# Patient Record
Sex: Female | Born: 1985 | Race: White | Hispanic: No | Marital: Married | Smoking: Never smoker
Health system: Southern US, Community
[De-identification: ages and names within clinical notes are randomized; demographics above are authoritative.]

---

## 2007-10-21 ENCOUNTER — Inpatient Hospital Stay: Payer: Self-pay | Admitting: Obstetrics and Gynecology

## 2007-11-30 LAB — HM PAP SMEAR

## 2010-08-30 ENCOUNTER — Emergency Department (HOSPITAL_COMMUNITY)
Admission: EM | Admit: 2010-08-30 | Discharge: 2010-08-30 | Disposition: A | Discharge status: Home / Self Care | Payer: BC Managed Care – PPO | Attending: Emergency Medicine | Admitting: Emergency Medicine

## 2010-08-30 DIAGNOSIS — R197 Diarrhea, unspecified: Secondary | ICD-10-CM | POA: Insufficient documentation

## 2010-08-30 DIAGNOSIS — R1915 Other abnormal bowel sounds: Secondary | ICD-10-CM | POA: Insufficient documentation

## 2010-08-30 DIAGNOSIS — R51 Headache: Secondary | ICD-10-CM | POA: Insufficient documentation

## 2010-08-30 DIAGNOSIS — R112 Nausea with vomiting, unspecified: Secondary | ICD-10-CM | POA: Insufficient documentation

## 2010-08-30 LAB — COMPREHENSIVE METABOLIC PANEL
ALT: 18 U/L (ref 0–35)
AST: 27 U/L (ref 0–37)
Albumin: 4.8 g/dL (ref 3.5–5.2)
Alkaline Phosphatase: 55 U/L (ref 39–117)
BUN: 17 mg/dL (ref 6–23)
Chloride: 108 mEq/L (ref 96–112)
Potassium: 4.4 mEq/L (ref 3.5–5.1)
Sodium: 139 mEq/L (ref 135–145)
Total Bilirubin: 0.9 mg/dL (ref 0.3–1.2)
Total Protein: 8.6 g/dL — ABNORMAL HIGH (ref 6.0–8.3)

## 2010-08-30 LAB — DIFFERENTIAL
Basophils Absolute: 0 10*3/uL (ref 0.0–0.1)
Eosinophils Absolute: 0 10*3/uL (ref 0.0–0.7)
Lymphocytes Relative: 3 % — ABNORMAL LOW (ref 12–46)
Lymphs Abs: 0.6 10*3/uL — ABNORMAL LOW (ref 0.7–4.0)
Neutrophils Relative %: 94 % — ABNORMAL HIGH (ref 43–77)

## 2010-08-30 LAB — CBC
MCV: 88.5 fL (ref 78.0–100.0)
Platelets: 347 10*3/uL (ref 150–400)
RBC: 5.04 MIL/uL (ref 3.87–5.11)
WBC: 18.9 10*3/uL — ABNORMAL HIGH (ref 4.0–10.5)

## 2011-08-06 ENCOUNTER — Ambulatory Visit: Payer: BC Managed Care – PPO | Admitting: Internal Medicine

## 2011-09-16 ENCOUNTER — Encounter: Payer: Self-pay | Admitting: Internal Medicine

## 2011-09-16 ENCOUNTER — Other Ambulatory Visit (HOSPITAL_COMMUNITY)
Admission: RE | Admit: 2011-09-16 | Discharge: 2011-09-16 | Disposition: A | Discharge status: Home / Self Care | Payer: BC Managed Care – PPO | Source: Ambulatory Visit | Attending: Internal Medicine | Admitting: Internal Medicine

## 2011-09-16 ENCOUNTER — Ambulatory Visit (INDEPENDENT_AMBULATORY_CARE_PROVIDER_SITE_OTHER): Payer: BC Managed Care – PPO | Admitting: Internal Medicine

## 2011-09-16 VITALS — BP 100/70 | HR 82 | Temp 98.7°F | Ht 61.0 in | Wt 148.0 lb

## 2011-09-16 DIAGNOSIS — Z113 Encounter for screening for infections with a predominantly sexual mode of transmission: Secondary | ICD-10-CM | POA: Insufficient documentation

## 2011-09-16 DIAGNOSIS — Z7189 Other specified counseling: Secondary | ICD-10-CM

## 2011-09-16 DIAGNOSIS — R87810 Cervical high risk human papillomavirus (HPV) DNA test positive: Secondary | ICD-10-CM | POA: Insufficient documentation

## 2011-09-16 DIAGNOSIS — Z0000 Encounter for general adult medical examination without abnormal findings: Secondary | ICD-10-CM

## 2011-09-16 DIAGNOSIS — G43909 Migraine, unspecified, not intractable, without status migrainosus: Secondary | ICD-10-CM

## 2011-09-16 DIAGNOSIS — Z01419 Encounter for gynecological examination (general) (routine) without abnormal findings: Secondary | ICD-10-CM | POA: Insufficient documentation

## 2011-09-16 DIAGNOSIS — J45909 Unspecified asthma, uncomplicated: Secondary | ICD-10-CM | POA: Insufficient documentation

## 2011-09-16 LAB — LIPID PANEL
Cholesterol: 176 mg/dL (ref 0–200)
HDL: 49.8 mg/dL (ref >39.00)
LDL Cholesterol: 106 mg/dL — ABNORMAL HIGH (ref 0–99)
VLDL: 20.6 mg/dL (ref 0.0–40.0)

## 2011-09-16 LAB — CBC WITH DIFFERENTIAL/PLATELET
Basophils Absolute: 0.1 10*3/uL (ref 0.0–0.1)
Eosinophils Relative: 5.7 % — ABNORMAL HIGH (ref 0.0–5.0)
HCT: 39.7 % (ref 36.0–46.0)
Hemoglobin: 13.4 g/dL (ref 12.0–15.0)
Lymphocytes Relative: 23 % (ref 12.0–46.0)
Lymphs Abs: 2.4 10*3/uL (ref 0.7–4.0)
Monocytes Relative: 6.2 % (ref 3.0–12.0)
Neutro Abs: 6.7 10*3/uL (ref 1.4–7.7)
RBC: 4.4 Mil/uL (ref 3.87–5.11)
RDW: 12.9 % (ref 11.5–14.6)
WBC: 10.4 10*3/uL (ref 4.5–10.5)

## 2011-09-16 LAB — COMPREHENSIVE METABOLIC PANEL
BUN: 9 mg/dL (ref 6–23)
CO2: 26 mEq/L (ref 19–32)
Calcium: 9.2 mg/dL (ref 8.4–10.5)
Chloride: 106 mEq/L (ref 96–112)
Creatinine, Ser: 0.7 mg/dL (ref 0.4–1.2)
GFR: 106.03 mL/min (ref >60.00)
Glucose, Bld: 93 mg/dL (ref 70–99)
Total Bilirubin: 0.4 mg/dL (ref 0.3–1.2)

## 2011-09-16 MED ORDER — ALBUTEROL SULFATE HFA 108 (90 BASE) MCG/ACT IN AERS: 2.0000 | INHALATION_SPRAY | Freq: Four times a day (QID) | RESPIRATORY_TRACT | Status: AC | PRN

## 2011-09-16 MED ORDER — PRENATAL AD PO TABS: 1.0000 | ORAL_TABLET | Freq: Every day | ORAL | Status: DC

## 2011-09-16 NOTE — Progress Notes (Signed)
Subjective:    Patient ID: Caitlin Le, female    DOB: 04-05-1986, 26 y.o.   MRN: [id number]  HPI 26YO female presents to establish care and for annual exam.  Only concern today is several year h/o migraine headaches described as pressure over temples, with no preceding aura.  No visual changes. Pos phono and photophobia.  Pos nausea.  Takes ibuprofen with minimal improvement. Headaches now occur 1x per week.  Never been on preventative meds.  Otherwise, feeling well. No recent dyspnea or cough.  Has not recently used asthma inhaler.  Overdue for PAP. Not using birth control. UTD on immunizations.  Outpatient Encounter Prescriptions as of 09/16/2011  Medication Sig Dispense Refill  . albuterol (PROVENTIL HFA;VENTOLIN HFA) 108 (90 BASE) MCG/ACT inhaler Inhale 2 puffs into the lungs every 6 (six) hours as needed.  1 Inhaler  3  . DISCONTD: albuterol (PROVENTIL HFA;VENTOLIN HFA) 108 (90 BASE) MCG/ACT inhaler Inhale 2 puffs into the lungs every 6 (six) hours as needed.      . Prenatal Vit-DSS-Fe Cbn-FA (PRENATAL AD) tablet Take 1 tablet by mouth daily.  90 tablet  3    Review of Systems  Constitutional: Negative for fever, chills, appetite change, fatigue and unexpected weight change.  HENT: Negative for ear pain, congestion, sore throat, trouble swallowing, neck pain, voice change and sinus pressure.   Eyes: Negative for visual disturbance.  Respiratory: Negative for cough, shortness of breath, wheezing and stridor.   Cardiovascular: Negative for chest pain, palpitations and leg swelling.  Gastrointestinal: Negative for nausea, vomiting, abdominal pain, diarrhea, constipation, blood in stool, abdominal distention and anal bleeding.  Genitourinary: Negative for dysuria and flank pain.  Musculoskeletal: Negative for myalgias, arthralgias and gait problem.  Skin: Negative for color change and rash.  Neurological: Positive for headaches. Negative for dizziness.  Hematological: Negative for  adenopathy. Does not bruise/bleed easily.  Psychiatric/Behavioral: Negative for suicidal ideas, sleep disturbance and dysphoric mood. The patient is not nervous/anxious.    BP 100/70  Pulse 82  Temp(Src) 98.7 F (37.1 C) (Oral)  Ht 5\' 1"  (1.549 m)  Wt 148 lb (67.132 kg)  BMI 27.96 kg/m2  SpO2 99%  LMP 08/31/2011     Objective:   Physical Exam  Constitutional: She is oriented to person, place, and time. She appears well-developed and well-nourished. No distress.  HENT:  Head: Normocephalic and atraumatic.  Right Ear: External ear normal.  Left Ear: External ear normal.  Nose: Nose normal.  Mouth/Throat: Oropharynx is clear and moist. No oropharyngeal exudate.  Eyes: Conjunctivae are normal. Pupils are equal, round, and reactive to light. Right eye exhibits no discharge. Left eye exhibits no discharge. No scleral icterus.  Neck: Normal range of motion. Neck supple. No tracheal deviation present. No thyromegaly present.  Cardiovascular: Normal rate, regular rhythm, normal heart sounds and intact distal pulses.  Exam reveals no gallop and no friction rub.   No murmur heard. Pulmonary/Chest: Effort normal and breath sounds normal. No respiratory distress. She has no wheezes. She has no rales. She exhibits no tenderness. Right breast exhibits no inverted nipple, no mass, no nipple discharge, no skin change and no tenderness. Left breast exhibits no inverted nipple, no mass, no nipple discharge, no skin change and no tenderness. Breasts are symmetrical.  Abdominal: Soft. Bowel sounds are normal. She exhibits no distension and no mass. There is no tenderness. There is no rebound and no guarding.  Genitourinary: Rectum normal, vagina normal and uterus normal. No breast swelling, tenderness, discharge or  bleeding. Pelvic exam was performed with patient prone. There is no rash, tenderness or lesion on the right labia. There is no rash, tenderness or lesion on the left labia. Uterus is not enlarged  and not tender. Cervix exhibits discharge. Cervix exhibits no motion tenderness and no friability. Right adnexum displays no mass, no tenderness and no fullness. Left adnexum displays no mass, no tenderness and no fullness. No erythema or tenderness around the vagina. No vaginal discharge found.       Limited exam because of pt discomfort, attempted using small speculum with minimal improvement.  Musculoskeletal: Normal range of motion. She exhibits no edema and no tenderness.  Lymphadenopathy:    She has no cervical adenopathy.  Neurological: She is alert and oriented to person, place, and time. No cranial nerve deficit. She exhibits normal muscle tone. Coordination normal.  Skin: Skin is warm and dry. No rash noted. She is not diaphoretic. No erythema. No pallor.  Psychiatric: She has a normal mood and affect. Her behavior is normal. Judgment and thought content normal.          Assessment & Plan:

## 2011-09-16 NOTE — Assessment & Plan Note (Signed)
Currently asymptomatic.  Will refill albuterol for use prn.

## 2011-09-16 NOTE — Assessment & Plan Note (Addendum)
Exam normal today. PAP is pending. Immunizations are UTD.  Will check CBC,CMP, lipids with labs.  Will start prenatal vitamin.

## 2011-09-16 NOTE — Assessment & Plan Note (Signed)
Headaches c/w migraines.  Not a good candidate for preventative med such as pamelor given not using birth control.  Will try using Relpax or Zomig for rescue.  Pt will email or call with update. Follow up 1 month.

## 2011-09-23 ENCOUNTER — Telehealth: Payer: Self-pay | Admitting: *Deleted

## 2011-09-23 NOTE — Telephone Encounter (Signed)
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 She will schedule her follow up pap smear for 6 months when she is here for her follow up in April.

## 2011-11-15 ENCOUNTER — Ambulatory Visit (INDEPENDENT_AMBULATORY_CARE_PROVIDER_SITE_OTHER): Payer: BC Managed Care – PPO | Admitting: Internal Medicine

## 2011-11-15 ENCOUNTER — Encounter: Payer: Self-pay | Admitting: Internal Medicine

## 2011-11-15 VITALS — BP 114/74 | HR 90 | Temp 98.3°F | Resp 16 | Wt 149.5 lb

## 2011-11-15 DIAGNOSIS — F419 Anxiety disorder, unspecified: Secondary | ICD-10-CM

## 2011-11-15 DIAGNOSIS — F411 Generalized anxiety disorder: Secondary | ICD-10-CM

## 2011-11-15 DIAGNOSIS — R110 Nausea: Secondary | ICD-10-CM | POA: Insufficient documentation

## 2011-11-15 DIAGNOSIS — G43909 Migraine, unspecified, not intractable, without status migrainosus: Secondary | ICD-10-CM

## 2011-11-15 MED ORDER — CITALOPRAM HYDROBROMIDE 10 MG PO TABS: 10.0000 mg | ORAL_TABLET | Freq: Every day | ORAL | Status: DC

## 2011-11-15 MED ORDER — PROMETHAZINE HCL 12.5 MG PO TABS: 12.5000 mg | ORAL_TABLET | Freq: Three times a day (TID) | ORAL | Status: DC | PRN

## 2011-11-15 MED ORDER — ALPRAZOLAM 0.25 MG PO TABS: 0.2500 mg | ORAL_TABLET | Freq: Three times a day (TID) | ORAL | Status: AC | PRN

## 2011-11-15 NOTE — Progress Notes (Signed)
Subjective:    Patient ID: Caitlin Le, female    DOB: [birth year], 26 y.o.   MRN: [id number]  HPI 26 year old female with history of anxiety presents for followup. She reports significant worsening in her symptoms of anxiety and depressed mood over the last few weeks. Her mother was recently diagnosed with possible seizure disorder. She reports significant stress in caring for her mother and others in her family. She is not currently taking any medication for anxiety. She is having difficulty functioning both at work and at home.  In regards to her history of migraine headaches, she reports recent worsening of her headache frequency. She reports no improvement with Zomig. She notes significant nausea with her migraines for which she has used Phenergan in the past with improvement. She would like to restart this.  Outpatient Encounter Prescriptions as of 11/15/2011  Medication Sig Dispense Refill  . albuterol (PROVENTIL HFA;VENTOLIN HFA) 108 (90 BASE) MCG/ACT inhaler Inhale 2 puffs into the lungs every 6 (six) hours as needed.  1 Inhaler  3  . ALPRAZolam (XANAX) 0.25 MG tablet Take 1 tablet (0.25 mg total) by mouth 3 (three) times daily as needed for sleep or anxiety.  90 tablet  3  . citalopram (CELEXA) 10 MG tablet Take 1 tablet (10 mg total) by mouth daily.  30 tablet  3  . Prenatal Vit-DSS-Fe Cbn-FA (PRENATAL AD) tablet Take 1 tablet by mouth daily.  90 tablet  3  . promethazine (PHENERGAN) 12.5 MG tablet Take 1 tablet (12.5 mg total) by mouth every 8 (eight) hours as needed for nausea.  60 tablet  3    Review of Systems  Constitutional: Negative for fever, chills, appetite change, fatigue and unexpected weight change.  HENT: Negative for ear pain, congestion, sore throat, trouble swallowing, neck pain, voice change and sinus pressure.   Eyes: Negative for visual disturbance.  Respiratory: Negative for cough, shortness of breath, wheezing and stridor.   Cardiovascular: Negative for chest  pain, palpitations and leg swelling.  Gastrointestinal: Positive for nausea. Negative for vomiting, abdominal pain, diarrhea, constipation, blood in stool, abdominal distention and anal bleeding.  Genitourinary: Negative for dysuria and flank pain.  Musculoskeletal: Negative for myalgias, arthralgias and gait problem.  Skin: Negative for color change and rash.  Neurological: Positive for headaches. Negative for dizziness.  Hematological: Negative for adenopathy. Does not bruise/bleed easily.  Psychiatric/Behavioral: Positive for dysphoric mood and agitation. Negative for suicidal ideas and sleep disturbance. The patient is nervous/anxious.    BP 114/74  Pulse 90  Temp(Src) 98.3 F (36.8 C) (Oral)  Resp 16  Wt 149 lb 8 oz (67.813 kg)  SpO2 99%     Objective:   Physical Exam  Constitutional: She is oriented to person, place, and time. She appears well-developed and well-nourished. No distress.  HENT:  Head: Normocephalic and atraumatic.  Right Ear: External ear normal.  Left Ear: External ear normal.  Nose: Nose normal.  Mouth/Throat: Oropharynx is clear and moist. No oropharyngeal exudate.  Eyes: Conjunctivae are normal. Pupils are equal, round, and reactive to light. Right eye exhibits no discharge. Left eye exhibits no discharge. No scleral icterus.  Neck: Normal range of motion. Neck supple. No tracheal deviation present. No thyromegaly present.  Cardiovascular: Normal rate, regular rhythm, normal heart sounds and intact distal pulses.  Exam reveals no gallop and no friction rub.   No murmur heard. Pulmonary/Chest: Effort normal and breath sounds normal. No respiratory distress. She has no wheezes. She has no rales. She exhibits  no tenderness.  Musculoskeletal: Normal range of motion. She exhibits no edema and no tenderness.  Lymphadenopathy:    She has no cervical adenopathy.  Neurological: She is alert and oriented to person, place, and time. No cranial nerve deficit. She  exhibits normal muscle tone. Coordination normal.  Skin: Skin is warm and dry. No rash noted. She is not diaphoretic. No erythema. No pallor.  Psychiatric: Her speech is normal and behavior is normal. Judgment and thought content normal. Her mood appears anxious. Cognition and memory are normal. She exhibits a depressed mood.          Assessment & Plan:

## 2011-11-15 NOTE — Assessment & Plan Note (Signed)
Worsened because of recent issues with mother's illness. Will start Celexa 10mg  daily and use xanax prn for severe episodes of anxiety. Discussed that xanax can cause drowsiness and she will not use if driving. Follow up 1 month.

## 2011-11-15 NOTE — Assessment & Plan Note (Signed)
Worsened recently with anxiety and nausea. Will refill phenergan to help with nausea. Treating anxiety as below. Follow up 1 month.

## 2011-12-20 ENCOUNTER — Ambulatory Visit (INDEPENDENT_AMBULATORY_CARE_PROVIDER_SITE_OTHER): Payer: BC Managed Care – PPO | Admitting: Internal Medicine

## 2011-12-20 ENCOUNTER — Encounter: Payer: Self-pay | Admitting: Internal Medicine

## 2011-12-20 VITALS — BP 120/82 | HR 67 | Temp 98.7°F | Ht 61.0 in | Wt 150.5 lb

## 2011-12-20 DIAGNOSIS — F419 Anxiety disorder, unspecified: Secondary | ICD-10-CM

## 2011-12-20 DIAGNOSIS — F411 Generalized anxiety disorder: Secondary | ICD-10-CM

## 2011-12-20 DIAGNOSIS — G43909 Migraine, unspecified, not intractable, without status migrainosus: Secondary | ICD-10-CM

## 2011-12-20 MED ORDER — NORTRIPTYLINE HCL 25 MG PO CAPS: 25.0000 mg | ORAL_CAPSULE | Freq: Every day | ORAL | Status: DC

## 2011-12-20 MED ORDER — ELETRIPTAN HYDROBROMIDE 20 MG PO TABS: 20.0000 mg | ORAL_TABLET | ORAL | Status: DC | PRN

## 2011-12-20 NOTE — Progress Notes (Signed)
Subjective:    Patient ID: Caitlin Le, female    DOB: August 18, 1985, 26 y.o.   MRN: [id number]  HPI 26 year old female with history of anxiety presents for followup. At her last visit, we started Celexa and Xanax to help with symptoms. She reports that she took the Celexa for approximately 2 days then stop. She also tried Xanax on a couple of occasions but felt no benefit so stopped. She reports that anxiety is slightly better controlled recently.  She is concerned today about frequent migraine headaches. She has had migraines for years. They're described as diffuse headaches associated with nausea, photophobia, and photophobia. No aura noted. They have recently been occurring multiple times per week. She has been taking over-the-counter medications such as Advil with no improvement.   Outpatient Encounter Prescriptions as of 12/20/2011  Medication Sig Dispense Refill  . promethazine (PHENERGAN) 12.5 MG tablet Take 1 tablet (12.5 mg total) by mouth every 8 (eight) hours as needed for nausea.  60 tablet  3  . albuterol (PROVENTIL HFA;VENTOLIN HFA) 108 (90 BASE) MCG/ACT inhaler Inhale 2 puffs into the lungs every 6 (six) hours as needed.  1 Inhaler  3  . eletriptan (RELPAX) 20 MG tablet One tablet by mouth at onset of headache. May repeat in 2 hours if headache persists or recurs. may repeat in 2 hours if necessary  10 tablet  0  . nortriptyline (PAMELOR) 25 MG capsule Take 1 capsule (25 mg total) by mouth at bedtime.  30 capsule  3  . Prenatal Vit-DSS-Fe Cbn-FA (PRENATAL AD) tablet Take 1 tablet by mouth daily.  90 tablet  3  . DISCONTD: citalopram (CELEXA) 10 MG tablet Take 1 tablet (10 mg total) by mouth daily.  30 tablet  3    Review of Systems  Constitutional: Negative for fever, chills, appetite change, fatigue and unexpected weight change.  HENT: Negative for ear pain, congestion, sore throat, trouble swallowing, neck pain, voice change and sinus pressure.   Eyes: Negative for visual  disturbance.  Respiratory: Negative for cough, shortness of breath, wheezing and stridor.   Cardiovascular: Negative for chest pain, palpitations and leg swelling.  Gastrointestinal: Negative for nausea, vomiting, abdominal pain, diarrhea, constipation, blood in stool, abdominal distention and anal bleeding.  Genitourinary: Negative for dysuria and flank pain.  Musculoskeletal: Negative for myalgias, arthralgias and gait problem.  Skin: Negative for color change and rash.  Neurological: Positive for headaches. Negative for dizziness.  Hematological: Negative for adenopathy. Does not bruise/bleed easily.  Psychiatric/Behavioral: Negative for suicidal ideas, disturbed wake/sleep cycle and dysphoric mood. The patient is nervous/anxious.    BP 120/82  Pulse 67  Temp 98.7 F (37.1 C) (Oral)  Ht 5\' 1"  (1.549 m)  Wt 150 lb 8 oz (68.266 kg)  BMI 28.44 kg/m2  SpO2 98%  LMP 12/13/2011     Objective:   Physical Exam  Constitutional: She is oriented to person, place, and time. She appears well-developed and well-nourished. No distress.  HENT:  Head: Normocephalic and atraumatic.  Right Ear: External ear normal.  Left Ear: External ear normal.  Nose: Nose normal.  Mouth/Throat: Oropharynx is clear and moist. No oropharyngeal exudate.  Eyes: Conjunctivae are normal. Pupils are equal, round, and reactive to light. Right eye exhibits no discharge. Left eye exhibits no discharge. No scleral icterus.  Neck: Normal range of motion. Neck supple. No tracheal deviation present. No thyromegaly present.  Cardiovascular: Normal rate, regular rhythm, normal heart sounds and intact distal pulses.  Exam reveals no gallop  and no friction rub.   No murmur heard. Pulmonary/Chest: Effort normal and breath sounds normal. No respiratory distress. She has no wheezes. She has no rales. She exhibits no tenderness.  Musculoskeletal: Normal range of motion. She exhibits no edema and no tenderness.  Lymphadenopathy:     She has no cervical adenopathy.  Neurological: She is alert and oriented to person, place, and time. No cranial nerve deficit. She exhibits normal muscle tone. Coordination normal.  Skin: Skin is warm and dry. No rash noted. She is not diaphoretic. No erythema. No pallor.  Psychiatric: She has a normal mood and affect. Her behavior is normal. Judgment and thought content normal.          Assessment & Plan:

## 2011-12-20 NOTE — Assessment & Plan Note (Signed)
Will try adding nortriptyline at bedtime to help prevent migraines. Will also use Relpax as needed for episodes of headache. She will followup in one month to assess progress.

## 2011-12-20 NOTE — Assessment & Plan Note (Signed)
Will try changing to nortriptyline both to help with anxiety and with migraines. Patient will start 25 mg at bedtime. She will call or e-mail if any problems with this medication. She'll followup in one month.

## 2012-01-28 ENCOUNTER — Ambulatory Visit: Payer: BC Managed Care – PPO | Admitting: Internal Medicine

## 2012-03-10 ENCOUNTER — Encounter: Payer: Self-pay | Admitting: Internal Medicine

## 2012-03-10 ENCOUNTER — Ambulatory Visit (INDEPENDENT_AMBULATORY_CARE_PROVIDER_SITE_OTHER): Payer: BC Managed Care – PPO | Admitting: Internal Medicine

## 2012-03-10 VITALS — BP 110/70 | HR 72 | Temp 98.2°F | Ht 61.0 in | Wt 144.2 lb

## 2012-03-10 DIAGNOSIS — IMO0001 Reserved for inherently not codable concepts without codable children: Secondary | ICD-10-CM | POA: Insufficient documentation

## 2012-03-10 DIAGNOSIS — Z309 Encounter for contraceptive management, unspecified: Secondary | ICD-10-CM

## 2012-03-10 MED ORDER — LEVONORGEST-ETH ESTRAD 91-DAY 0.15-0.03 MG PO TABS: 1.0000 | ORAL_TABLET | Freq: Every day | ORAL | Status: DC

## 2012-03-10 NOTE — Progress Notes (Signed)
  Subjective:    Patient ID: Caitlin Le, female    DOB: 06/10/1986, 26 y.o.   MRN: [id number]  HPI 26 year old female with history of anxiety and migraine headaches presents for acute visit. Her primary concern today is need for contraception. She reports that she is generally doing well and anxiety and migraines have been much better controlled since she got out of a bad relationship. She would like to start contraception. She does not wish to become pregnant at this time. She denies any recent sexual activity. She denies any vaginal discharge, vaginal pain, pelvic pain, or other concerns.  Outpatient Encounter Prescriptions as of 03/10/2012  Medication Sig Dispense Refill  . albuterol (PROVENTIL HFA;VENTOLIN HFA) 108 (90 BASE) MCG/ACT inhaler Inhale 2 puffs into the lungs every 6 (six) hours as needed.  1 Inhaler  3  . levonorgestrel-ethinyl estradiol (SEASONALE,INTROVALE,JOLESSA) 0.15-0.03 MG tablet Take 1 tablet by mouth daily.  1 Package  4   BP 110/70  Pulse 72  Temp 98.2 F (36.8 C) (Oral)  Ht 5\' 1"  (1.549 m)  Wt 144 lb 4 oz (65.431 kg)  BMI 27.26 kg/m2  SpO2 97%  LMP 02/06/2012  Review of Systems  Constitutional: Negative for fever, chills, appetite change, fatigue and unexpected weight change.  HENT: Negative for ear pain, congestion, sore throat, trouble swallowing, neck pain, voice change and sinus pressure.   Eyes: Negative for visual disturbance.  Respiratory: Negative for cough, shortness of breath, wheezing and stridor.   Cardiovascular: Negative for chest pain, palpitations and leg swelling.  Gastrointestinal: Negative for nausea, vomiting, abdominal pain, diarrhea, constipation, blood in stool, abdominal distention and anal bleeding.  Genitourinary: Negative for dysuria and flank pain.  Musculoskeletal: Negative for myalgias, arthralgias and gait problem.  Skin: Negative for color change and rash.  Neurological: Negative for dizziness and headaches.  Hematological:  Negative for adenopathy. Does not bruise/bleed easily.  Psychiatric/Behavioral: Negative for suicidal ideas, disturbed wake/sleep cycle and dysphoric mood. The patient is not nervous/anxious.        Objective:   Physical Exam  Constitutional: She is oriented to person, place, and time. She appears well-developed and well-nourished. No distress.  HENT:  Head: Normocephalic and atraumatic.  Right Ear: External ear normal.  Left Ear: External ear normal.  Nose: Nose normal.  Mouth/Throat: Oropharynx is clear and moist. No oropharyngeal exudate.  Eyes: Conjunctivae are normal. Pupils are equal, round, and reactive to light. Right eye exhibits no discharge. Left eye exhibits no discharge. No scleral icterus.  Neck: Normal range of motion. Neck supple. No tracheal deviation present. No thyromegaly present.  Cardiovascular: Normal rate, regular rhythm, normal heart sounds and intact distal pulses.  Exam reveals no gallop and no friction rub.   No murmur heard. Pulmonary/Chest: Effort normal and breath sounds normal. No respiratory distress. She has no wheezes. She has no rales. She exhibits no tenderness.  Musculoskeletal: Normal range of motion. She exhibits no edema and no tenderness.  Lymphadenopathy:    She has no cervical adenopathy.  Neurological: She is alert and oriented to person, place, and time. No cranial nerve deficit. She exhibits normal muscle tone. Coordination normal.  Skin: Skin is warm and dry. No rash noted. She is not diaphoretic. No erythema. No pallor.  Psychiatric: She has a normal mood and affect. Her behavior is normal. Judgment and thought content normal.          Assessment & Plan:

## 2012-03-10 NOTE — Assessment & Plan Note (Signed)
Patient is interested in starting contraception. We discussed different options including oral contraceptive pills, NuvaRing, Depo Provera, implanted devices and IUD. Will start with OCP. Pt will wait until she starts next menses prior to starting. She will call with any questions or concerns. She will use barrier protection to prevent STDs.

## 2012-09-07 ENCOUNTER — Ambulatory Visit: Payer: BC Managed Care – PPO | Admitting: Internal Medicine

## 2013-01-09 ENCOUNTER — Emergency Department: Payer: Self-pay | Admitting: Emergency Medicine

## 2013-01-10 ENCOUNTER — Emergency Department: Payer: Self-pay | Admitting: Emergency Medicine

## 2013-01-10 LAB — CBC
MCHC: 34 g/dL (ref 32.0–36.0)
MCV: 89 fL (ref 80–100)
Platelet: 284 10*3/uL (ref 150–440)
RBC: 3.88 10*6/uL (ref 3.80–5.20)

## 2013-01-10 LAB — URINALYSIS, COMPLETE
Bilirubin,UR: NEGATIVE
Glucose,UR: NEGATIVE mg/dL (ref 0–75)
Nitrite: POSITIVE
Ph: 5 (ref 4.5–8.0)
Protein: 100
Specific Gravity: 1.014 (ref 1.003–1.030)
WBC UR: 2785 /HPF (ref 0–5)

## 2013-01-10 LAB — BASIC METABOLIC PANEL
Co2: 28 mmol/L (ref 21–32)
EGFR (Non-African Amer.): >60
Glucose: 113 mg/dL — ABNORMAL HIGH (ref 65–99)

## 2013-01-11 LAB — PREGNANCY, URINE: Pregnancy Test, Urine: NEGATIVE m[IU]/mL

## 2013-01-20 LAB — URINALYSIS, COMPLETE
Bilirubin,UR: NEGATIVE
Glucose,UR: NEGATIVE mg/dL (ref 0–75)
Ph: 5 (ref 4.5–8.0)
Protein: 30
Specific Gravity: 1.019 (ref 1.003–1.030)

## 2013-01-20 LAB — COMPREHENSIVE METABOLIC PANEL
Anion Gap: 5 — ABNORMAL LOW (ref 7–16)
BUN: 14 mg/dL (ref 7–18)
Bilirubin,Total: 0.7 mg/dL (ref 0.2–1.0)
Calcium, Total: 9.2 mg/dL (ref 8.5–10.1)
Co2: 27 mmol/L (ref 21–32)
Creatinine: 0.96 mg/dL (ref 0.60–1.30)
EGFR (Non-African Amer.): >60
Osmolality: 266 (ref 275–301)
Potassium: 3.5 mmol/L (ref 3.5–5.1)
SGPT (ALT): 17 U/L (ref 12–78)
Sodium: 132 mmol/L — ABNORMAL LOW (ref 136–145)

## 2013-01-20 LAB — CBC
HCT: 33.5 % — ABNORMAL LOW (ref 35.0–47.0)
HGB: 11.1 g/dL — ABNORMAL LOW (ref 12.0–16.0)
MCH: 29.4 pg (ref 26.0–34.0)
Platelet: 341 10*3/uL (ref 150–440)
WBC: 34.6 10*3/uL — ABNORMAL HIGH (ref 3.6–11.0)

## 2013-01-21 ENCOUNTER — Inpatient Hospital Stay: Payer: Self-pay | Admitting: Internal Medicine

## 2013-01-22 LAB — BASIC METABOLIC PANEL
Anion Gap: 5 — ABNORMAL LOW (ref 7–16)
BUN: 7 mg/dL (ref 7–18)
Chloride: 103 mmol/L (ref 98–107)
Co2: 28 mmol/L (ref 21–32)
Creatinine: 0.83 mg/dL (ref 0.60–1.30)
EGFR (Non-African Amer.): >60
Glucose: 95 mg/dL (ref 65–99)
Osmolality: 270 (ref 275–301)

## 2013-01-22 LAB — CBC WITH DIFFERENTIAL/PLATELET
Basophil #: 0 10*3/uL (ref 0.0–0.1)
Eosinophil %: 1 %
HCT: 27 % — ABNORMAL LOW (ref 35.0–47.0)
HGB: 9.2 g/dL — ABNORMAL LOW (ref 12.0–16.0)
Lymphocyte %: 10.9 %
MCHC: 34.1 g/dL (ref 32.0–36.0)
Neutrophil #: 12.8 10*3/uL — ABNORMAL HIGH (ref 1.4–6.5)
Neutrophil %: 79.4 %
RDW: 13.4 % (ref 11.5–14.5)
WBC: 16 10*3/uL — ABNORMAL HIGH (ref 3.6–11.0)

## 2013-01-23 LAB — URINE CULTURE

## 2013-01-25 LAB — CULTURE, BLOOD (SINGLE)

## 2014-07-31 IMAGING — CT CT ABD-PELV W/O CM
1 of 2 series · 15 of 32 positions shown, 19 images · non-contrast
Comparison: none

REASON FOR EXAM: (1) right cva tenderness WBC 34.6 fever; (2) right cva
tenderness WBC 34.6 fever
COMMENTS:

PROCEDURE:     CT  - CT ABDOMEN AND PELVIS W[DATE] [DATE]
RESULT:     History: CVA tenderness and fever. Graf comparison study: No
prior.
TECHNIQUE: Standard nonenhanced CT obtained. Liver normal. Spleen normal.
Gallbladder normal. No biliary distention. Pancreas normal.

[Series 2: 3mm soft tissue · axial · 0.63mm/px · z∈[+380,+794]mm · 15 of 150 slices shown, 19 images]
[im 6/150  soft-tissue]
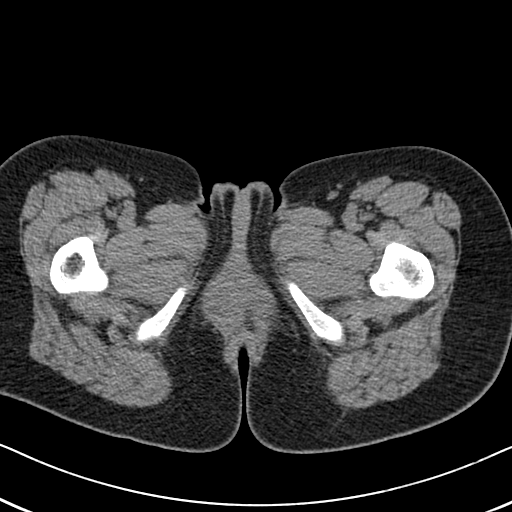
[im 6/150  bone]
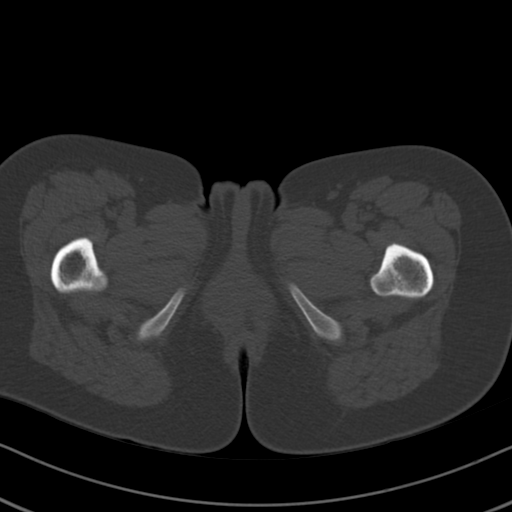
[im 18/150  soft-tissue]
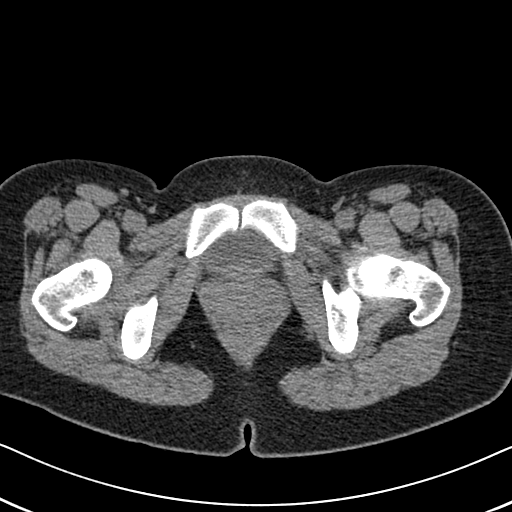
[im 30/150  soft-tissue]
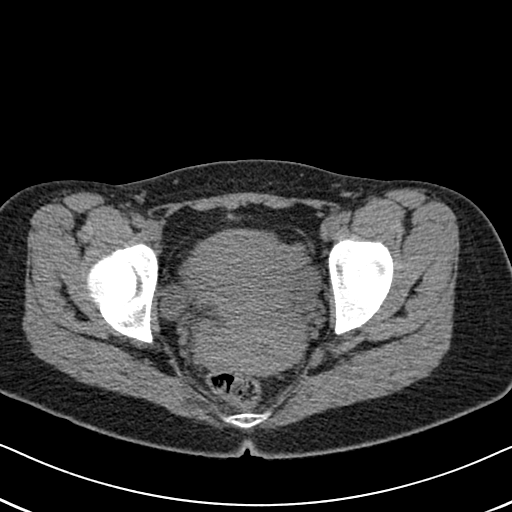
[im 42/150  soft-tissue]
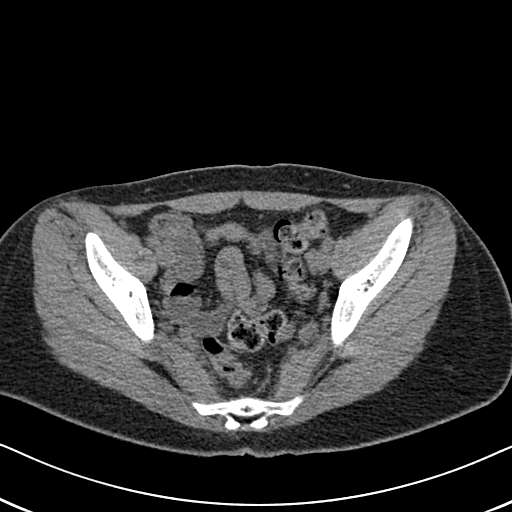
[im 54/150  soft-tissue]
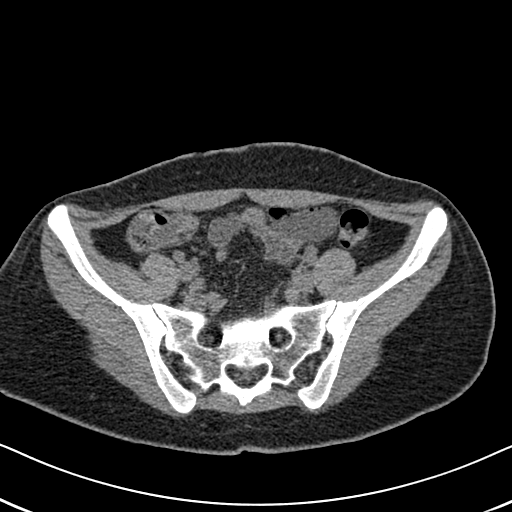
[im 66/150  soft-tissue]
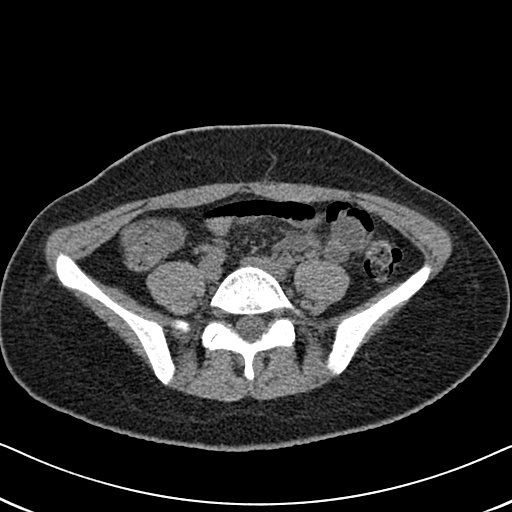
[im 78/150  soft-tissue]
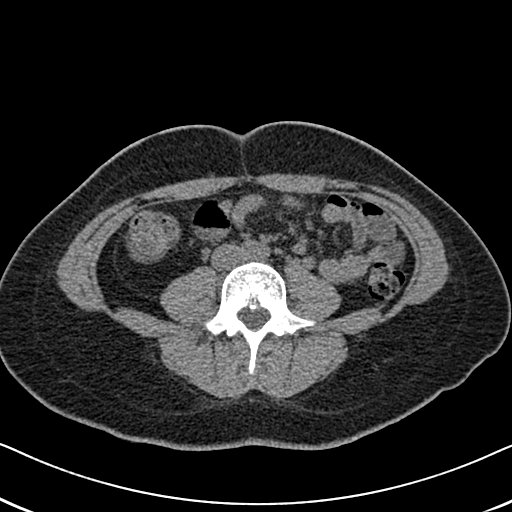
[im 84/150  soft-tissue]
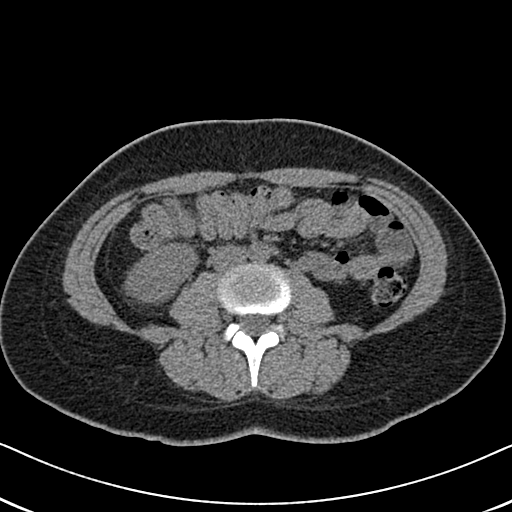
[im 96/150  soft-tissue]
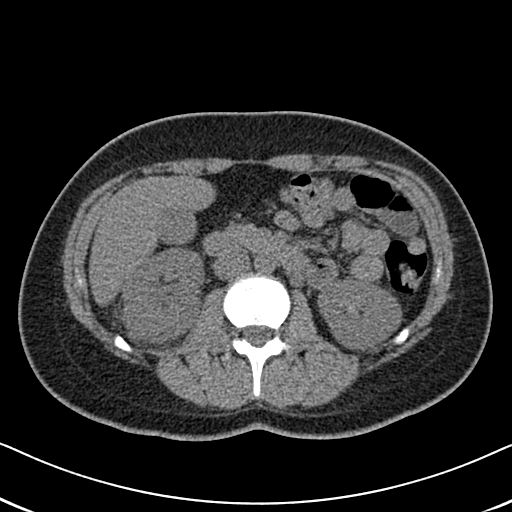
[im 96/150  bone]
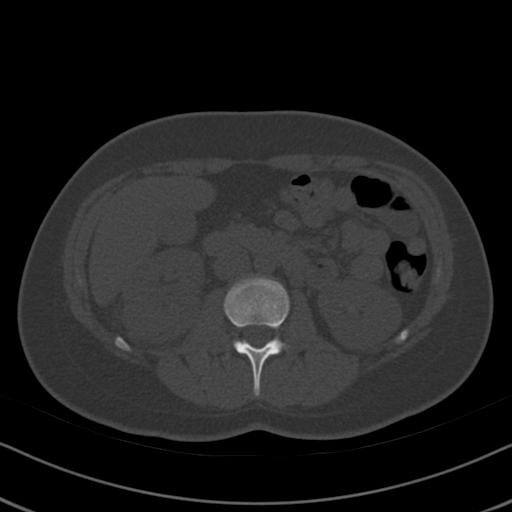
[im 108/150  soft-tissue]
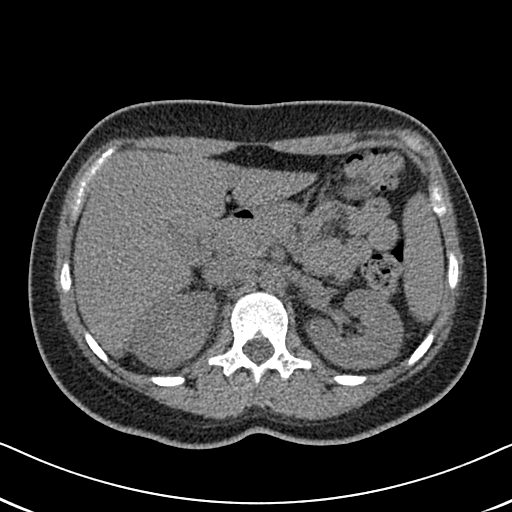
[im 120/150  soft-tissue]
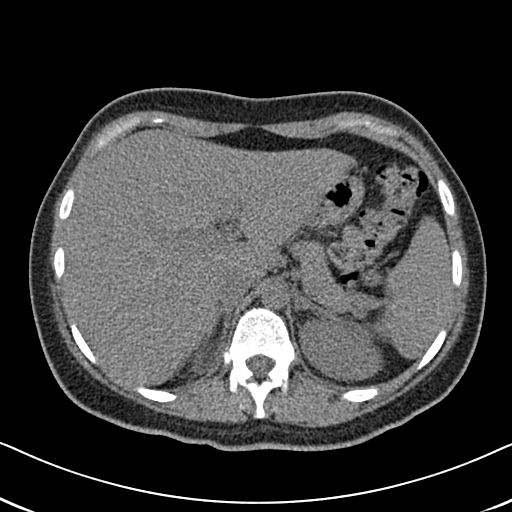
[im 126/150  lung]
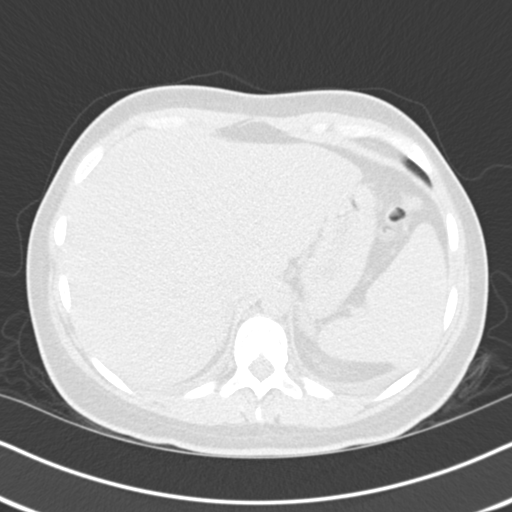
[im 132/150  soft-tissue]
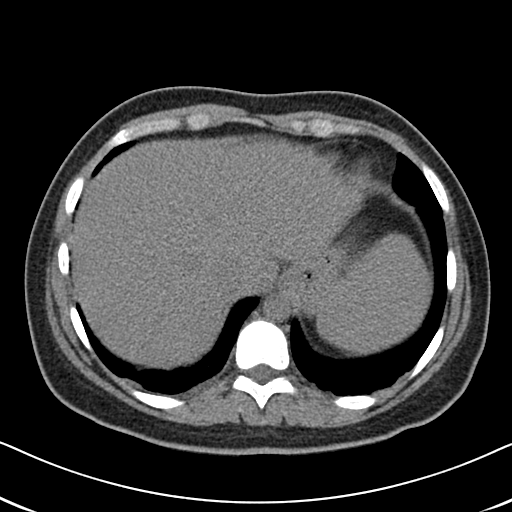
[im 132/150  lung]
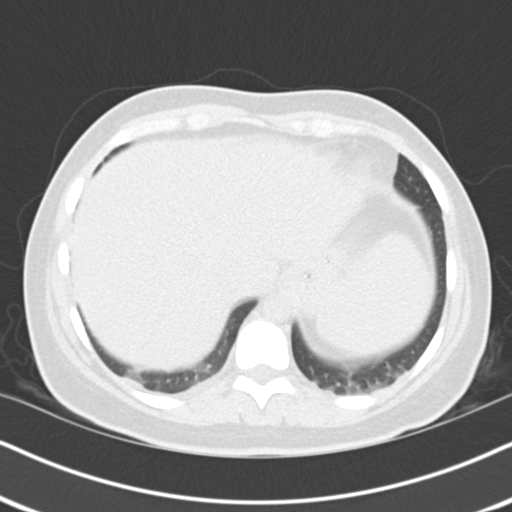
[im 138/150  lung]
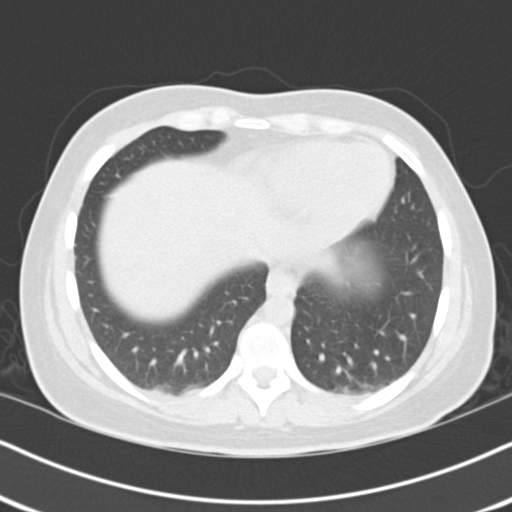
[im 144/150  soft-tissue]
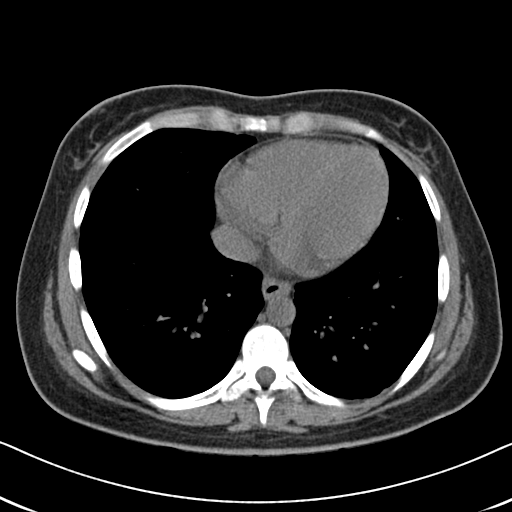
[im 144/150  lung]
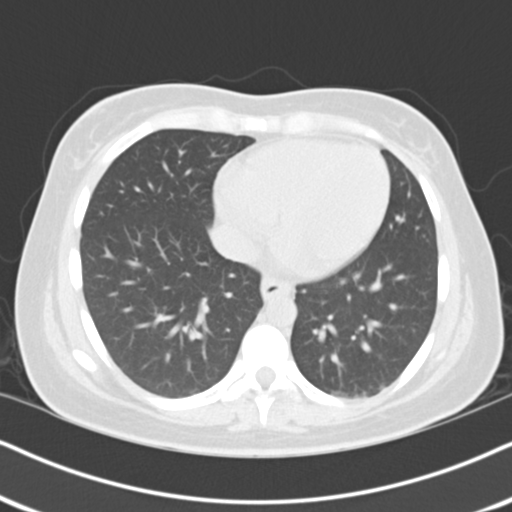

[15 of 32 positions shown; findings below may reference images not displayed]

FINDINGS: Comparison Study: No prior.
Adrenals normal. Mild right renal swelling with perinephric fat plane
streaking present. No ureteral obstruction or stone noted. These changes
could be related to pyelonephritis. Left kidney unremarkable. Left renal
collecting system normal. Bladder is unremarkable. Mild endometrial
thickening is noted. This could be cyclical. No adnexal mass noted.

No adenopathy. Abdominal aorta normal in caliber.

No inflammatory changes noted in right or left or quadrant. No bowel
distention. No free air.

Mild atelectasis lung bases. No acute bony abnormality.
IMPRESSION: Findings suggesting right pyelonephritis.

## 2014-08-30 ENCOUNTER — Ambulatory Visit: Payer: Self-pay | Admitting: Family Medicine

## 2014-10-03 ENCOUNTER — Encounter: Payer: Self-pay | Admitting: Internal Medicine

## 2014-10-07 ENCOUNTER — Emergency Department
Admit: 2014-10-07 | Disposition: A | Discharge status: Home / Self Care | Payer: Self-pay | Admitting: Emergency Medicine

## 2014-10-21 NOTE — H&P (Signed)
PATIENT NAME:  Caitlin Le, Caitlin Le MR#:  [id number] DATE OF BIRTH:  Aug 16, 1985  DATE OF ADMISSION:  01/21/2013  PRIMARY CARE PHYSICIAN: Nonlocal.   REFERRING PHYSICIAN: Dr. Manson PasseyBrown.   CHIEF COMPLAINT: Fever, vomiting and back pain.   HISTORY OF PRESENT ILLNESS: The patient is a 29 year old Caucasian female who was seen by ER physician 1 week ago and diagnosed with a urinary tract infection and discharged home with a prescription for antibiotic. She is coming back with a 1-day history of fever, vomiting, back pain and frequent urination. The patient is reporting that she has not filled the prescription as suggested by the ER physician as she does not have money. Since yesterday, she started having fever associated with vomiting, back pain and frequent urination. Her urine pregnancy test was negative, but WBC count was extremely high at 34.6 and the urinalysis is positive with leuk esterase 1+. The patient was febrile with temperature of 100.5, and she was given vancomycin and levofloxacin after cultures and hospitalist team is called to admit the patient. CT abdomen and pelvis was done without contrast which has showed questionable right-sided perinephric inflammation. The patient is still complaining of low back discomfort during my examination, and her vomiting is significantly resolved today. No other complaints.   PAST MEDICAL HISTORY: None.   PAST SURGICAL HISTORY: None.   ALLERGIES: AMOXICILLIN GIVE HER HIVES AND THROAT CLOSING SENSATION, SHELLFISH.   HOME MEDICATIONS: None.   PSYCHOSOCIAL HISTORY: Lives with boyfriend. Denies smoking, alcohol or illicit drug usage.   FAMILY HISTORY: Reviewed. Denies any.   REVIEW OF SYSTEMS:  CONSTITUTIONAL: Complaining of fever and weakness. Denies any weight loss or weight gain.  EYES: Denies blurry vision, glaucoma.  EARS, NOSE, THROAT: No tinnitus, ear pain.  RESPIRATION: Denies any cough, COPD.   CARDIOVASCULAR: No chest pain, palpitations.   GASTROINTESTINAL: Complaining of nausea, vomiting. Denies any diarrhea. Has lower abdominal pain.  GENITOURINARY: No dysuria but frequent urination. No hematuria.  GYNECOLOGY AND BREASTS: Denies breast mass or vaginal discharge.  ENDOCRINE: Denies polyuria, nocturia, thyroid problems.  HEMATOLOGIC AND LYMPHATIC: Denies any anemia, easy bruising, bleeding.  INTEGUMENTARY: No acne, rash, lesions.  MUSCULOSKELETAL: No joint pain in the neck. Complaining of back pain. Denies any redness.  NEUROLOGIC: Denies any vertigo, ataxia.  PSYCHIATRIC: Denies any ADD, OCD.   PHYSICAL EXAMINATION:  VITAL SIGNS: Temperature 100.5. Blood pressure 102/58 initially with a pulse of 110. Eventually, pulse was 85, blood pressure 96/53. Pulse ox 98%.  GENERAL APPEARANCE: Not in any acute distress but sick-looking, moderately built and moderately nourished.  HEENT: Normocephalic, atraumatic. Pupils are equally reacting to light and accommodation. No scleral icterus. No conjunctival injection. No sinus tenderness. No postnasal drip.  NECK: Supple. No JVD. No thyromegaly. Range of motion is intact.  LUNGS: Clear to auscultation bilaterally. No accessory muscle usage. No anterior chest wall tenderness on palpation.  CARDIAC: S1, S2 normal. Regular rate and rhythm, tachycardic.  GASTROINTESTINAL: Soft. Bowel sounds are positive in all 4 quadrants. Nontender, nondistended. No hepatosplenomegaly. No masses felt. Positive CVA tenderness.  NEUROLOGIC: Awake, alert, oriented x3. Cranial nerves II through XII are intact. Motor and sensory are intact. Reflexes are 2+.  EXTREMITIES: No edema. No cyanosis. No clubbing.   SKIN: Warm to touch. Normal turgor. No rashes. No lesions.  MUSCULOSKELETAL: No joint effusion, tenderness or erythema.   LABS AND IMAGING STUDIES: Urine pregnancy test is negative. Glucose 108, BUN 14, creatinine 0.96, sodium 132, potassium 3.5. Anion gap 5. Serum osmolality 266. LFTs: Albumin  3.2. The rest of  the LFTs are normal. WBC is elevated , hemoglobin 11.1, hematocrit 33.5, platelets 341. Urinalysis yellow in color, hazy in appearance, ketones 1+, specific gravity 1.019, pH 5.0, leuk esterase 1+, nitrites negative, bacteria 2+. CAT scan of the abdomen and pelvis has revealed questionable perinephric inflammation on the right. The possibility of pyelonephritis cannot be excluded.   ASSESSMENT AND PLAN:  1. Acute pyelonephritis: Will admit the patient to medical/surgical unit. Will provide her intravenous fluids. Blood cultures and urine cultures were obtained. Will provide her with intravenous levofloxacin and vancomycin.  2. Hydration with intravenous fluids.  3. Antinausea medication.  4. Fever from acute appendicitis: Will provide her Tylenol as needed.  5. History of migraines: Will provide her Fioricet on as needed basis.  6. Will provide her gastrointestinal prophylaxis.   Plan of care was discussed in detail with the patient. She is aware of the plan.   TOTAL TIME SPENT ON ADMISSION: 45 minutes.   ____________________________ Ramonita Lab, MD ag:gb D: 01/21/2013 02:57:46 ET T: 01/21/2013 03:32:36 ET JOB#: [id number]  cc: Ramonita Lab, MD, <Dictator> Ramonita Lab MD ELECTRONICALLY SIGNED 01/24/2013 7:09

## 2014-10-21 NOTE — Discharge Summary (Signed)
PATIENT NAME:  Caitlin Le, Caitlin Le MR#:  [id number] DATE OF BIRTH:  [birth year]  DATE OF ADMISSION:  01/21/2013  ADMITTING PHYSICIAN:  Dr. Margaretmary Eddy  DATE OF DISCHARGE:  01/22/2013  DISCHARGING PHYSICIAN:  Dr. Gladstone Lighter  CONSULTATIONS IN THE HOSPITAL:  None.  PRIMARY PHYSICIAN:  Nonlocal, at [facility].   DISCHARGE DIAGNOSES: 1.  Systemic inflammatory response syndrome.  2.  Right-sided pyelonephritis.  3.  Tension headache.  DISCHARGE MEDICATIONS:  1.  Norco 5/325 mg 1 tablet p.o. q. 6 hours p.r.n.  2.  Promethazine 12.5 mg tablet 1 to 2 tablets q. 6 hours p.r.n. for nausea.  3.  Levaquin 500 mg p.o. daily for 5 more days.   DISCHARGE DIET:  Regular diet.   DISCHARGE ACTIVITY:  As tolerated.   FOLLOWUP INSTRUCTIONS:  PCP follow up in 2 to 3 weeks.   LABS AND IMAGING STUDIES PRIOR TO DISCHARGE:  WBC 16.0, whereas  the WBC on admission was elevated at 34.6, platelet count is 282, hemoglobin 9.2, hematocrit 27.0. Sodium 136, potassium 3.6, chloride 103, bicarb 28, BUN 7, creatinine 0.83, glucose 95, calcium 8.7. Blood cultures remain negative. CT of the abdomen and pelvis without contrast revealing right-sided pyelonephritis. Urinalysis was 1+ leuk esterase, 48 WBCs, and 2+ bacteria. ALT 17, AST 13, alk phos 78, total bilirubin 0.7, albumin 3.2. Urine cultures growing greater than 100,000 colonies of Gram-negative rod.  BRIEF HOSPITAL COURSE:  Caitlin Le is a 29 year old Caucasian female with no significant past medical history, presented to the hospital secondary to fever, chills, abdominal pain and also headache. She was found to have a fever and also elevated white count of 34.6, and had a CT scan which showed right pyelonephritis.   1.  Systemic inflammatory response syndrome secondary to right pyelonephritis. Admitted, started on IV Levaquin. Blood cultures were negative. Urine cultures were growing Gram-negative rods at the time of discharge. She responded well by dropping her  white count to about 16,000, so is being discharged to home on p.o. Levaquin. Further sensitivities need to be followed up on this, urine cultures.   2.  Headache, likely tension headache. She has history of migraines. She was requiring IV morphine while in the hospital, and is being discharged on p.o. Norco at this time.   Her course has been otherwise uneventful in the hospital.   DISCHARGE CONDITION:  Stable.   DISCHARGE DISPOSITION:  Home.   Time spent on discharge:  40 minutes.    ____________________________ Gladstone Lighter, MD rk:mr D: 01/22/2013 15:30:10 ET T: 01/22/2013 22:34:21 ET JOB#: [id number]  cc: Gladstone Lighter, MD, <Dictator> Gladstone Lighter MD ELECTRONICALLY SIGNED 02/01/2013 15:10

## 2016-11-06 ENCOUNTER — Emergency Department
Admission: EM | Admit: 2016-11-06 | Discharge: 2016-11-06 | Disposition: A | Discharge status: Home / Self Care | Payer: Medicaid Other | Attending: Emergency Medicine | Admitting: Emergency Medicine

## 2016-11-06 ENCOUNTER — Encounter: Payer: Self-pay | Admitting: *Deleted

## 2016-11-06 DIAGNOSIS — J45909 Unspecified asthma, uncomplicated: Secondary | ICD-10-CM | POA: Diagnosis not present

## 2016-11-06 DIAGNOSIS — R21 Rash and other nonspecific skin eruption: Secondary | ICD-10-CM | POA: Diagnosis present

## 2016-11-06 DIAGNOSIS — L237 Allergic contact dermatitis due to plants, except food: Secondary | ICD-10-CM

## 2016-11-06 DIAGNOSIS — B86 Scabies: Secondary | ICD-10-CM

## 2016-11-06 MED ORDER — HYDROXYZINE HCL 50 MG PO TABS: 50.0000 mg | ORAL_TABLET | Freq: Three times a day (TID) | ORAL | 0 refills | Status: DC | PRN

## 2016-11-06 MED ORDER — METHYLPREDNISOLONE SODIUM SUCC 125 MG IJ SOLR
125.0000 mg | Freq: Once | INTRAMUSCULAR | Status: AC
  Administered 2016-11-06: 125 mg via INTRAMUSCULAR
  Filled 2016-11-06: qty 2

## 2016-11-06 MED ORDER — PERMETHRIN 5 % EX CREA: 1.0000 "application " | TOPICAL_CREAM | Freq: Once | CUTANEOUS | 0 refills | Status: AC

## 2016-11-06 MED ORDER — HYDROXYZINE HCL 50 MG PO TABS
50.0000 mg | ORAL_TABLET | Freq: Once | ORAL | Status: AC
  Administered 2016-11-06: 50 mg via ORAL
  Filled 2016-11-06: qty 1

## 2016-11-06 MED ORDER — METHYLPREDNISOLONE 4 MG PO TBPK: ORAL_TABLET | ORAL | 0 refills | Status: DC

## 2016-11-06 NOTE — ED Provider Notes (Signed)
[facility] Emergency Department Provider Note   ____________________________________________   First MD Initiated Contact with Patient 11/06/16 [phone]     (approximate)  I have reviewed the triage vital signs and the nursing notes.   HISTORY  Chief Complaint Rash    HPI Edit Caitlin Le is a 31 y.o. female patient presents with 2 different type of skin lesions. First presentations papular lesions and ascending order patient a stroke and upper extremities. Patient also has similar lesions in the web space of her fingers. Second complaint of dermatitis consistent vesicle lesions on the dorsal aspect of her right hand and the volar aspect of left forearm. Patient state both lesions itch but increases at night. Patient states no  Relief with calamine lotion. Patient rates her discomfort a 6/10. Patient describes discomfort as intense itching. Patient states she has not been around any plants but does have a cat that stays out and comes in only in the evening. Patient states over the weekend she slept in a motel. Past Medical History:  Diagnosis Date  . Asthma    hospitalized as child    Patient Active Problem List   Diagnosis Date Noted  . Contraception 03/10/2012  . Anxiety 11/15/2011  . Asthma 09/16/2011  . General medical exam 09/16/2011    Past Surgical History:  Procedure Laterality Date  . VAGINAL DELIVERY     episotomy, [facility]    Prior to Admission medications   Medication Sig Start Date End Date Taking? Authorizing Provider  albuterol (PROVENTIL HFA;VENTOLIN HFA) 108 (90 BASE) MCG/ACT inhaler Inhale 2 puffs into the lungs every 6 (six) hours as needed. 09/16/11   Shelia MediaWalker, Jennifer A, MD  hydrOXYzine (ATARAX/VISTARIL) 50 MG tablet Take 1 tablet (50 mg total) by mouth 3 (three) times daily as needed. 11/06/16   Joni ReiningSmith, Olvin Rohr K, PA-C  levonorgestrel-ethinyl estradiol (SEASONALE,INTROVALE,JOLESSA) 0.15-0.03 MG tablet Take 1 tablet by mouth daily.  03/10/12 03/10/13  Shelia MediaWalker, Jennifer A, MD  methylPREDNISolone (MEDROL DOSEPAK) 4 MG TBPK tablet Take Tapered dose as directed 11/06/16   Joni ReiningSmith, Joliet Mallozzi K, PA-C  permethrin (ELIMITE) 5 % cream Apply 1 application topically once. 11/06/16 11/06/16  Joni ReiningSmith, Cathline Dowen K, PA-C    Allergies Amoxicillin  Family History  Problem Relation Age of Onset  . Hypertension Mother   . Hyperlipidemia Mother   . Hypertension Father   . Anxiety disorder Sister   . Anxiety disorder Brother   . Asthma Son   . Cancer Paternal Aunt     Breast    Social History Social History  Substance Use Topics  . Smoking status: Never Smoker  . Smokeless tobacco: Never Used  . Alcohol use Yes     Comment: Occasional    Review of Systems  Constitutional: No fever/chills Eyes: No visual changes. ENT: No sore throat. Cardiovascular: Denies chest pain. Respiratory: Denies shortness of breath. Gastrointestinal: No abdominal pain.  No nausea, no vomiting.  No diarrhea.  No constipation. Genitourinary: Negative for dysuria. Musculoskeletal: Negative for back pain. Skin: Negative for rash. Neurological: Negative for headaches, focal weakness or numbness. Psychiatric:Anxiety Allergic/Immunilogical: Amoxicillin ____________________________________________   PHYSICAL EXAM:  VITAL SIGNS: ED Triage Vitals  Enc Vitals Group     BP --      Pulse --      Resp --      Temp --      Temp src --      SpO2 --      Weight 11/06/16 0828 140 lb (63.5 kg)  Height 11/06/16 0828 5\' 1"  (1.549 m)     Head Circumference --      Peak Flow --      Pain Score 11/06/16 0827 6     Pain Loc --      Pain Edu? --      Excl. in GC? --     Constitutional: Alert and oriented. Well appearing and in no acute distress. Eyes: Conjunctivae are normal. PERRL. EOMI. Head: Atraumatic. Nose: No congestion/rhinnorhea. Mouth/Throat: Mucous membranes are moist.  Oropharynx non-erythematous. Neck: No stridor.  No cervical spine tenderness to  palpation. Hematological/Lymphatic/Immunilogical: No cervical lymphadenopathy. Cardiovascular: Normal rate, regular rhythm. Grossly normal heart sounds.  Good peripheral circulation. Respiratory: Normal respiratory effort.  No retractions. Lungs CTAB. Gastrointestinal: Soft and nontender. No distention. No abdominal bruits. No CVA tenderness. Musculoskeletal: No lower extremity tenderness nor edema.  No joint effusions. Neurologic:  Normal speech and language. No gross focal neurologic deficits are appreciated. No gait instability. Skin:  Skin is warm, dry and intact. Vascular and papular lesion as described above Psychiatric: Mood and affect are normal. Speech and behavior are normal.  ____________________________________________   LABS (all labs ordered are listed, but only abnormal results are displayed)  Labs Reviewed - No data to display ____________________________________________  EKG   ____________________________________________  RADIOLOGY   ____________________________________________   PROCEDURES  Procedure(s) performed: None  Procedures  Critical Care performed: No  ____________________________________________   INITIAL IMPRESSION / ASSESSMENT AND PLAN / ED COURSE  Pertinent labs & imaging results that were available during my care of the patient were reviewed by me and considered in my medical decision making (see chart for details).  Contact dermatitis and scabies. Patient given discharge care instructions. Patient advised follow "clinic if no improvement in one week.      ____________________________________________   FINAL CLINICAL IMPRESSION(S) / ED DIAGNOSES  Final diagnoses:  Allergic contact dermatitis due to plants, except food  Scabies      NEW MEDICATIONS STARTED DURING THIS VISIT:  New Prescriptions   HYDROXYZINE (ATARAX/VISTARIL) 50 MG TABLET    Take 1 tablet (50 mg total) by mouth 3 (three) times daily as needed.    METHYLPREDNISOLONE (MEDROL DOSEPAK) 4 MG TBPK TABLET    Take Tapered dose as directed   PERMETHRIN (ELIMITE) 5 % CREAM    Apply 1 application topically once.     Note:  This document was prepared using Dragon voice recognition software and may include unintentional dictation errors.    Joni Reining, PA-C 11/06/16 1610    Emily Filbert, MD 11/06/16 [phone]

## 2016-11-06 NOTE — ED Triage Notes (Addendum)
States a rash that she noticed on her forearm last week, states rash has now spread all over her body, noticed on trunk and both arms, arrives with lotion on skin, states itching

## 2017-12-17 ENCOUNTER — Encounter: Payer: Self-pay | Admitting: Emergency Medicine

## 2017-12-17 ENCOUNTER — Ambulatory Visit
Admission: EM | Admit: 2017-12-17 | Discharge: 2017-12-17 | Disposition: A | Discharge status: Home / Self Care | Payer: Medicaid Other | Attending: Family Medicine | Admitting: Family Medicine

## 2017-12-17 ENCOUNTER — Other Ambulatory Visit: Payer: Self-pay

## 2017-12-17 DIAGNOSIS — L0100 Impetigo, unspecified: Secondary | ICD-10-CM | POA: Diagnosis not present

## 2017-12-17 DIAGNOSIS — L255 Unspecified contact dermatitis due to plants, except food: Secondary | ICD-10-CM

## 2017-12-17 MED ORDER — SULFAMETHOXAZOLE-TRIMETHOPRIM 200-40 MG/5ML PO SUSP: 20.0000 mL | Freq: Two times a day (BID) | ORAL | 0 refills | Status: AC

## 2017-12-17 MED ORDER — PREDNISONE 10 MG PO TABS: ORAL_TABLET | ORAL | 0 refills | Status: DC

## 2017-12-17 MED ORDER — MUPIROCIN 2 % EX OINT: TOPICAL_OINTMENT | CUTANEOUS | 0 refills | Status: DC

## 2017-12-17 NOTE — ED Provider Notes (Signed)
[facility] ____________________________________________  Time seen: Approximately 6:28 PM  I have reviewed the triage vital signs and the nursing notes.   HISTORY  Chief Complaint Rash   HPI Caitlin Le is a 32 y.o. female presents for evaluation of rash to bilateral forearms is been present for the last week and can continues to spread.  States has been trying to treat with topical calamine which helps briefly but that does not resolve.  States did try to bleach the area yesterday on her right arm as well without any change.  States just prior to rash onset she was cleaning around the house and was exposed to poison oak and IV.  Denies any other changes in foods, medicines, lotions, detergents or other contacts.  States over the last day or 2 the rash particular on the right arm is starting to burn and is painful.  States some oozing.  Also in the last year to rash to tops of hands.  Reports tetanus immunization is up-to-date.  Denies insect bite, tick bite or tick attachment.  Continues eating drink well.  Denies other aggravating or alleviating factors.  Reports otherwise feels well. Denies chest pain, shortness of breath, abdominal pain. Denies recent sickness. Denies recent antibiotic use.   No LMP recorded. (Menstrual status: IUD).DEnies pregnancy    Past Medical History:  Diagnosis Date  . Asthma    hospitalized as child    Patient Active Problem List   Diagnosis Date Noted  . Contraception 03/10/2012  . Anxiety 11/15/2011  . Asthma 09/16/2011  . General medical exam 09/16/2011    Past Surgical History:  Procedure Laterality Date  . VAGINAL DELIVERY     episotomy, [facility]     No current facility-administered medications for this encounter.   Current Outpatient Medications:  .  albuterol (PROVENTIL HFA;VENTOLIN HFA) 108 (90 BASE) MCG/ACT inhaler, Inhale 2 puffs into the lungs every 6 (six) hours as needed., Disp: 1 Inhaler, Rfl: 3 .   levonorgestrel (MIRENA) 20 MCG/24HR IUD, 1 each by Intrauterine route once., Disp: , Rfl:  .  mupirocin ointment (BACTROBAN) 2 %, Apply two times a day for 7 days., Disp: 22 g, Rfl: 0 .  predniSONE (DELTASONE) 10 MG tablet, Take 60mg  orally day one, then 55 mg orally day two, then 50 mg orally day three, then taper by 5 mg daily over 12 days until complete., Disp: 35 tablet, Rfl: 0 .  sulfamethoxazole-trimethoprim (BACTRIM,SEPTRA) 200-40 MG/5ML suspension, Take 20 mLs by mouth 2 (two) times daily for 7 days., Disp: 280 mL, Rfl: 0  Allergies Amoxicillin  Family History  Problem Relation Age of Onset  . Hypertension Mother   . Hyperlipidemia Mother   . Hypertension Father   . Anxiety disorder Sister   . Anxiety disorder Brother   . Asthma Son   . Cancer Paternal Aunt        Breast    Social History Social History   Tobacco Use  . Smoking status: Never Smoker  . Smokeless tobacco: Never Used  Substance Use Topics  . Alcohol use: Not Currently    Comment: Occasional  . Drug use: No    Review of Systems Constitutional: No fever Cardiovascular: Denies chest pain. Respiratory: Denies shortness of breath. Gastrointestinal: No abdominal pain.  Steady gait. Musculoskeletal: Negative for back pain. Skin: Positive for rash  ____________________________________________   PHYSICAL EXAM:  VITAL SIGNS: ED Triage Vitals  Enc Vitals Group     BP 12/17/17 1800 103/64  Pulse Rate 12/17/17 1800 73     Resp 12/17/17 1800 18     Temp 12/17/17 1800 98.9 F (37.2 C)     Temp Source 12/17/17 1800 Oral     SpO2 12/17/17 1800 100 %     Weight 12/17/17 1757 140 lb (63.5 kg)     Height 12/17/17 1757 5\' 1"  (1.549 m)     Head Circumference --      Peak Flow --      Pain Score 12/17/17 1756 6     Pain Loc --      Pain Edu? --      Excl. in GC? --     Constitutional: Alert and oriented. Well appearing and in no acute distress. ENT      Head: Normocephalic and  atraumatic. Cardiovascular: Normal rate, regular rhythm. Grossly normal heart sounds.  Good peripheral circulation. Respiratory: Normal respiratory effort without tachypnea nor retractions. Breath sounds are clear and equal bilaterally. No wheezes, rales, rhonchi. Musculoskeletal: Stea patient isdy gait.  Neurologic:  Normal speech and language.neurologic deficits are appreciated. Speech is normal. No gait instability.  Skin:  Skin is warm, dry.except: Bilateral forearms with minimally erythematous vesicular clustered in a linear formation rash present with few scattered vesicular lesions more proximal forearms as well as dorsal hands and right hip pruritic, no surrounding erythema, no induration, no drainage.  Except right distal forearm area of some crusting and slight honey colored drainage with tenderness and slight surrounding erythema nonedematous. Psychiatric: Mood and affect are normal. Speech and behavior are normal. Patient exhibits appropriate insight and judgment   ___________________________________________   LABS (all labs ordered are listed, but only abnormal results are displayed)  Labs Reviewed - No data to display  PROCEDURES Procedures   INITIAL IMPRESSION / ASSESSMENT AND PLAN / ED COURSE  Pertinent labs & imaging results that were available during my care of the patient were reviewed by me and considered in my medical decision making (see chart for details).  Well-appearing patient.  No acute distress.  Suspect plant contact dermatitis and will treat with oral prednisone taper.  However right forearm area suspicious of impetigo and will start on topical Bactroban and oral Bactrim.  Patient amoxicillin allergic with swelling.  No history of MRSA.  Encourage supportive care, avoidance of scratching and close monitoring.Discussed indication, risks and benefits of medications with patient.  Discussed follow up with Primary care physician this week. Discussed follow up and  return parameters including no resolution or any worsening concerns. Patient verbalized understanding and agreed to plan.   ____________________________________________   FINAL CLINICAL IMPRESSION(S) / ED DIAGNOSES  Final diagnoses:  Contact dermatitis due to plants, except food, unspecified contact dermatitis type  Impetigo     ED Discharge Orders        Ordered    predniSONE (DELTASONE) 10 MG tablet     12/17/17 1818    sulfamethoxazole-trimethoprim (BACTRIM,SEPTRA) 200-40 MG/5ML suspension  2 times daily     12/17/17 1818    mupirocin ointment (BACTROBAN) 2 %     12/17/17 1818       Note: This dictation was prepared with Dragon dictation along with smaller phrase technology. Any transcriptional errors that result from this process are unintentional.         Renford DillsMiller, Yamili Lichtenwalner, NP 12/17/17 1835

## 2017-12-17 NOTE — ED Triage Notes (Signed)
Patient c/o red, raised, itchy rash that started 1 week ago on bilateral arms, and right side of abdomen. Patient was exposed to poison ivy 1 week ago. Has used Calamine lotion, tec-nu wash and bleach wash with no improvement.

## 2017-12-17 NOTE — Discharge Instructions (Addendum)
Take medication as prescribed. Rest. Drink plenty of fluids. Avoid scratching.   Follow up with your primary care physician this week as needed. Return to Urgent care for new or worsening concerns.   

## 2018-03-18 ENCOUNTER — Other Ambulatory Visit: Payer: Self-pay

## 2018-03-18 ENCOUNTER — Ambulatory Visit
Admission: EM | Admit: 2018-03-18 | Discharge: 2018-03-18 | Disposition: A | Discharge status: Home / Self Care | Payer: BLUE CROSS/BLUE SHIELD | Attending: Family Medicine | Admitting: Family Medicine

## 2018-03-18 ENCOUNTER — Encounter: Payer: Self-pay | Admitting: Emergency Medicine

## 2018-03-18 DIAGNOSIS — G44009 Cluster headache syndrome, unspecified, not intractable: Secondary | ICD-10-CM

## 2018-03-18 MED ORDER — PROMETHAZINE HCL 25 MG PO TABS: 25.0000 mg | ORAL_TABLET | Freq: Four times a day (QID) | ORAL | 0 refills | Status: DC | PRN

## 2018-03-18 MED ORDER — BUTALBITAL-APAP-CAFFEINE 50-325-40 MG PO TABS: ORAL_TABLET | ORAL | 0 refills | Status: DC

## 2018-03-18 MED ORDER — KETOROLAC TROMETHAMINE 60 MG/2ML IM SOLN
60.0000 mg | Freq: Once | INTRAMUSCULAR | Status: AC
  Administered 2018-03-18: 60 mg via INTRAMUSCULAR

## 2018-03-18 NOTE — ED Provider Notes (Signed)
[facility]    CSN: [id number] Arrival date & time: 03/18/18  7829     History   Chief Complaint Chief Complaint  Patient presents with  . Eye Pain    HPI Tamsen Bowie Doiron is a 32 y.o. female.   32 yo female with a h/o cluster headaches presents with a c/o headache since this morning. States was recently seen in ED for same and given toradol, compazine and fioricet x 1 which resolved it, however headache returned since this am and unrelieved with otc meds. Denies any fevers, chills, numbness/tingling.   The history is provided by the patient.    Past Medical History:  Diagnosis Date  . Asthma    hospitalized as child    Patient Active Problem List   Diagnosis Date Noted  . Contraception 03/10/2012  . Anxiety 11/15/2011  . Asthma 09/16/2011  . General medical exam 09/16/2011    Past Surgical History:  Procedure Laterality Date  . VAGINAL DELIVERY     episotomy, [facility]    OB History   None      Home Medications    Prior to Admission medications   Medication Sig Start Date End Date Taking? Authorizing Provider  albuterol (PROVENTIL HFA;VENTOLIN HFA) 108 (90 BASE) MCG/ACT inhaler Inhale 2 puffs into the lungs every 6 (six) hours as needed. 09/16/11  Yes Shelia Media, MD  levonorgestrel (MIRENA) 20 MCG/24HR IUD 1 each by Intrauterine route once.   Yes [provider]  butalbital-acetaminophen-caffeine (FIORICET, ESGIC) 50-325-40 MG tablet 1-2 tabs po q 8 hours prn 03/18/18   Payton Mccallum, MD  mupirocin ointment (BACTROBAN) 2 % Apply two times a day for 7 days. 12/17/17   Renford Dills, NP  predniSONE (DELTASONE) 10 MG tablet Take 60mg  orally day one, then 55 mg orally day two, then 50 mg orally day three, then taper by 5 mg daily over 12 days until complete. 12/17/17   Renford Dills, NP  promethazine (PHENERGAN) 25 MG tablet Take 1 tablet (25 mg total) by mouth every 6 (six) hours as needed for nausea or vomiting. 03/18/18   Payton Mccallum, MD    Family History Family History  Problem Relation Age of Onset  . Hypertension Mother   . Hyperlipidemia Mother   . Hypertension Father   . Anxiety disorder Sister   . Anxiety disorder Brother   . Asthma Son   . Cancer Paternal Aunt        Breast    Social History Social History   Tobacco Use  . Smoking status: Never Smoker  . Smokeless tobacco: Never Used  Substance Use Topics  . Alcohol use: Not Currently    Comment: Occasional  . Drug use: No     Allergies   Amoxicillin and Shellfish allergy   Review of Systems Review of Systems   Physical Exam Triage Vital Signs ED Triage Vitals [03/18/18 0948]  Enc Vitals Group     BP 99/68     Pulse Rate 65     Resp 18     Temp 98.3 F (36.8 C)     Temp Source Oral     SpO2 100 %     Weight 135 lb (61.2 kg)     Height 5\' 1"  (1.549 m)     Head Circumference      Peak Flow      Pain Score 6     Pain Loc      Pain Edu?  Excl. in GC?    No data found.  Updated Vital Signs BP 99/68 (BP Location: Left Arm)   Pulse 65   Temp 98.3 F (36.8 C) (Oral)   Resp 18   Ht 5\' 1"  (1.549 m)   Wt 61.2 kg   SpO2 100%   BMI 25.51 kg/m   Visual Acuity Right Eye Distance: 20/20 corrected Left Eye Distance: 20/25 corrected Bilateral Distance: 20/20 corrected  Right Eye Near:   Left Eye Near:    Bilateral Near:     Physical Exam  Constitutional: She is oriented to person, place, and time. She appears well-developed and well-nourished. No distress.  HENT:  Head: Normocephalic.  Nose: Nose normal.  Mouth/Throat: Oropharynx is clear and moist and mucous membranes are normal.  Eyes: Pupils are equal, round, and reactive to light. Conjunctivae and EOM are normal. Right eye exhibits no discharge. Left eye exhibits no discharge. No scleral icterus.  Neck: Normal range of motion. Neck supple. No JVD present. No tracheal deviation present. No thyromegaly present.  Cardiovascular: Normal rate, regular rhythm  and normal heart sounds.  Pulmonary/Chest: Effort normal and breath sounds normal. No stridor. No respiratory distress. She has no wheezes.  Abdominal: Soft.  Musculoskeletal: She exhibits no edema or tenderness.  Lymphadenopathy:    She has no cervical adenopathy.  Neurological: She is alert and oriented to person, place, and time. She has normal reflexes. She displays normal reflexes. No cranial nerve deficit or sensory deficit. She exhibits normal muscle tone. Coordination normal.  Skin: Skin is warm and dry. No rash noted. She is not diaphoretic. No erythema. No pallor.  Psychiatric: Thought content normal.  Vitals reviewed.    UC Treatments / Results  Labs (all labs ordered are listed, but only abnormal results are displayed) Labs Reviewed - No data to display  EKG None  Radiology No results found.  Procedures Procedures (including critical care time)  Medications Ordered in UC Medications  ketorolac (TORADOL) injection 60 mg (60 mg Intramuscular Given 03/18/18 1022)    Initial Impression / Assessment and Plan / UC Course  I have reviewed the triage vital signs and the nursing notes.  Pertinent labs & imaging results that were available during my care of the patient were reviewed by me and considered in my medical decision making (see chart for details).      Final Clinical Impressions(s) / UC Diagnoses   Final diagnoses:  Cluster headache, not intractable, unspecified chronicity pattern   Discharge Instructions   None    ED Prescriptions    Medication Sig Dispense Auth. Provider   promethazine (PHENERGAN) 25 MG tablet Take 1 tablet (25 mg total) by mouth every 6 (six) hours as needed for nausea or vomiting. 15 tablet Derrel Moore, MD   butalbital-acetaminophen-caffeine (FIORICET, ESGIC) 50-325-40 MG tablet 1-2 tabs po q 8 hours prn 20 tablet Payton Mccallumonty, Jairon Ripberger, MD     1. diagnosis reviewed with patient; given toradol 60mg  im x 1  2. rx as per orders above;  reviewed possible side effects, interactions, risks and benefits  3. Recommend supportive treatment with otc analgesics prn  4. Follow-up prn if symptoms worsen or don't improve   Controlled Substance Prescriptions [place] Controlled Substance Registry consulted? Not Applicable   Payton Mccallumonty, Steffie Waggoner, MD 03/18/18 Rickey [id number]

## 2018-03-18 NOTE — ED Triage Notes (Signed)
Patient c/o right eye pain that started 1 week ago. [facility] and was given Fioricet for cluster headaches. Patient stated the medication was working up until this morning when the pain returned. She contacted her PCP and she stated they can't see her until October.

## 2018-12-11 ENCOUNTER — Ambulatory Visit
Admission: EM | Admit: 2018-12-11 | Discharge: 2018-12-11 | Disposition: A | Discharge status: Home / Self Care | Payer: Medicaid Other | Attending: Family Medicine | Admitting: Family Medicine

## 2018-12-11 ENCOUNTER — Encounter: Payer: Self-pay | Admitting: Emergency Medicine

## 2018-12-11 ENCOUNTER — Other Ambulatory Visit: Payer: Self-pay

## 2018-12-11 DIAGNOSIS — L237 Allergic contact dermatitis due to plants, except food: Secondary | ICD-10-CM

## 2018-12-11 MED ORDER — PREDNISONE 20 MG PO TABS: ORAL_TABLET | ORAL | 0 refills | Status: DC

## 2018-12-11 NOTE — ED Triage Notes (Signed)
Patient c/o itchy rash that started 2 weeks ago. She came in contact with poison ivy/oak 2 weeks ago.

## 2018-12-11 NOTE — ED Provider Notes (Signed)
[facility]    CSN: [id number] Arrival date & time: 12/11/18  1004     History   Chief Complaint Chief Complaint  Patient presents with  . Rash    HPI Caitlin Le is a 33 y.o. female.   33 yo female with a c/o itchy rash for the past 2 weeks since coming in contact to poison ivy/oak. States she's tried over the counter creams/medications without relief. Denies any fevers, chills.    Rash   Past Medical History:  Diagnosis Date  . Asthma    hospitalized as child    Patient Active Problem List   Diagnosis Date Noted  . Contraception 03/10/2012  . Anxiety 11/15/2011  . Asthma 09/16/2011  . General medical exam 09/16/2011    Past Surgical History:  Procedure Laterality Date  . VAGINAL DELIVERY     episotomy, [facility]    OB History   No obstetric history on file.      Home Medications    Prior to Admission medications   Medication Sig Start Date End Date Taking? Authorizing Provider  levonorgestrel (MIRENA) 20 MCG/24HR IUD 1 each by Intrauterine route once.   Yes [provider]  albuterol (PROVENTIL HFA;VENTOLIN HFA) 108 (90 BASE) MCG/ACT inhaler Inhale 2 puffs into the lungs every 6 (six) hours as needed. 09/16/11   Jackolyn Confer, MD  butalbital-acetaminophen-caffeine (FIORICET, ESGIC) [phone] MG tablet 1-2 tabs po q 8 hours prn 03/18/18   Norval Gable, MD  mupirocin ointment (BACTROBAN) 2 % Apply two times a day for 7 days. 12/17/17   Marylene Land, NP  predniSONE (DELTASONE) 20 MG tablet 3 tabs po qd x 2 days, then 2 tabs po qd x 3 days, then 1 tab po qd x 3 days, then half a tab po qd x 2 days. 12/11/18   Norval Gable, MD  promethazine (PHENERGAN) 25 MG tablet Take 1 tablet (25 mg total) by mouth every 6 (six) hours as needed for nausea or vomiting. 03/18/18   Norval Gable, MD    Family History Family History  Problem Relation Age of Onset  . Hypertension Mother   . Hyperlipidemia Mother   . Hypertension Father   .  Anxiety disorder Sister   . Anxiety disorder Brother   . Asthma Son   . Cancer Paternal Aunt        Breast    Social History Social History   Tobacco Use  . Smoking status: Never Smoker  . Smokeless tobacco: Never Used  Substance Use Topics  . Alcohol use: Not Currently    Comment: Occasional  . Drug use: No     Allergies   Amoxicillin and Shellfish allergy   Review of Systems Review of Systems  Skin: Positive for rash.     Physical Exam Triage Vital Signs ED Triage Vitals [12/11/18 1031]  Enc Vitals Group     BP 108/72     Pulse Rate 72     Resp 18     Temp 98.6 F (37 C)     Temp Source Oral     SpO2 100 %     Weight 130 lb (59 kg)     Height 5\' 2"  (1.575 m)     Head Circumference      Peak Flow      Pain Score 6     Pain Loc      Pain Edu?      Excl. in [place]?    No data  found.  Updated Vital Signs BP 108/72 (BP Location: Right Arm)   Pulse 72   Temp 98.6 F (37 C) (Oral)   Resp 18   Ht 5\' 2"  (1.575 m)   Wt 59 kg   SpO2 100%   BMI 23.78 kg/m   Visual Acuity Right Eye Distance:   Left Eye Distance:   Bilateral Distance:    Right Eye Near:   Left Eye Near:    Bilateral Near:     Physical Exam Vitals signs and nursing note reviewed.  Constitutional:      General: She is not in acute distress.    Appearance: She is not toxic-appearing or diaphoretic.  Skin:    Findings: Rash (papular vesicular rash on arms, legs, neck) present.  Neurological:     Mental Status: She is alert.      UC Treatments / Results  Labs (all labs ordered are listed, but only abnormal results are displayed) Labs Reviewed - No data to display  EKG None  Radiology No results found.  Procedures Procedures (including critical care time)  Medications Ordered in UC Medications - No data to display  Initial Impression / Assessment and Plan / UC Course  I have reviewed the triage vital signs and the nursing notes.  Pertinent labs & imaging results that  were available during my care of the patient were reviewed by me and considered in my medical decision making (see chart for details).      Final Clinical Impressions(s) / UC Diagnoses   Final diagnoses:  Contact dermatitis due to poison ivy    ED Prescriptions    Medication Sig Dispense Auth. Provider   predniSONE (DELTASONE) 20 MG tablet 3 tabs po qd x 2 days, then 2 tabs po qd x 3 days, then 1 tab po qd x 3 days, then half a tab po qd x 2 days. 16 tablet Ralyn Stlaurent, Pamala Hurryrlando, MD     1. diagnosis reviewed with patient 2. rx as per orders above; reviewed possible side effects, interactions, risks and benefits  3. Follow-up prn if symptoms worsen or don't improve   Controlled Substance Prescriptions [place] Controlled Substance Registry consulted? Not Applicable   Payton Mccallumonty, Ravneet Spilker, MD 12/11/18 1108

## 2019-05-21 ENCOUNTER — Other Ambulatory Visit: Payer: Self-pay | Admitting: Neurology

## 2019-05-21 DIAGNOSIS — G43719 Chronic migraine without aura, intractable, without status migrainosus: Secondary | ICD-10-CM

## 2019-06-04 ENCOUNTER — Ambulatory Visit
Admission: RE | Admit: 2019-06-04 | Discharge: 2019-06-04 | Disposition: A | Discharge status: Home / Self Care | Payer: Medicaid Other | Source: Ambulatory Visit | Attending: Neurology | Admitting: Neurology

## 2019-06-04 ENCOUNTER — Other Ambulatory Visit: Payer: Self-pay

## 2019-06-04 DIAGNOSIS — G43719 Chronic migraine without aura, intractable, without status migrainosus: Secondary | ICD-10-CM | POA: Insufficient documentation

## 2019-07-21 ENCOUNTER — Ambulatory Visit: Payer: Medicaid Other

## 2019-07-21 ENCOUNTER — Other Ambulatory Visit: Payer: Self-pay

## 2019-07-21 ENCOUNTER — Ambulatory Visit
Admission: EM | Admit: 2019-07-21 | Discharge: 2019-07-21 | Disposition: A | Discharge status: Home / Self Care | Payer: Medicaid Other | Attending: Family Medicine | Admitting: Family Medicine

## 2019-07-21 DIAGNOSIS — S92345A Nondisplaced fracture of fourth metatarsal bone, left foot, initial encounter for closed fracture: Secondary | ICD-10-CM | POA: Diagnosis present

## 2019-07-21 DIAGNOSIS — W208XXA Other cause of strike by thrown, projected or falling object, initial encounter: Secondary | ICD-10-CM | POA: Diagnosis not present

## 2019-07-21 MED ORDER — TRAMADOL HCL 50 MG PO TABS: 50.0000 mg | ORAL_TABLET | Freq: Three times a day (TID) | ORAL | 0 refills | Status: AC | PRN

## 2019-07-21 NOTE — Discharge Instructions (Signed)
Rest.  Ice.   Elevation.  Medication as needed.  Call [facility] or [facility] tomorrow for follow up.  Take care  Dr. Adriana Simas

## 2019-07-21 NOTE — ED Provider Notes (Signed)
[facility]    CSN: [id number] Arrival date & time: 07/21/19  1626      History   Chief Complaint Chief Complaint  Patient presents with  . Foot Injury   HPI  34 year old female presents with the above complaint.  Patient reports that she dropped a 25 pound weight on her left foot this morning.  She reports pain, swelling, and bruising.  She states that she is unable to ambulate at this time.  She states that her pain is 10/10 in severity.  No relieving factors.  Exacerbated by activity/weightbearing.  No other reported symptoms.  No other complaints.  Past Medical History:  Diagnosis Date  . Asthma    hospitalized as child    Patient Active Problem List   Diagnosis Date Noted  . Contraception 03/10/2012  . Anxiety 11/15/2011  . Asthma 09/16/2011  . General medical exam 09/16/2011    Past Surgical History:  Procedure Laterality Date  . VAGINAL DELIVERY     episotomy, [facility]    OB History   No obstetric history on file.      Home Medications    Prior to Admission medications   Medication Sig Start Date End Date Taking? Authorizing Provider  albuterol (PROVENTIL HFA;VENTOLIN HFA) 108 (90 BASE) MCG/ACT inhaler Inhale 2 puffs into the lungs every 6 (six) hours as needed. 09/16/11  Yes Shelia Media, MD  Fremanezumab-vfrm (AJOVY) 225 MG/1.5ML SOAJ Inject 1.5 mLs into the skin.   Yes [provider]  levonorgestrel (MIRENA) 20 MCG/24HR IUD 1 each by Intrauterine route once.   Yes [provider]  rizatriptan (MAXALT) 10 MG tablet Take 10 mg by mouth as needed for migraine. May repeat in 2 hours if needed   Yes [provider]  traMADol (ULTRAM) 50 MG tablet Take 1-2 tablets (50-100 mg total) by mouth every 8 (eight) hours as needed. 07/21/19   Tommie Sams, DO  promethazine (PHENERGAN) 25 MG tablet Take 1 tablet (25 mg total) by mouth every 6 (six) hours as needed for nausea or vomiting. 03/18/18 07/21/19  Payton Mccallum, MD     Family History Family History  Problem Relation Age of Onset  . Hypertension Mother   . Hyperlipidemia Mother   . Hypertension Father   . Anxiety disorder Sister   . Anxiety disorder Brother   . Asthma Son   . Cancer Paternal Aunt        Breast    Social History Social History   Tobacco Use  . Smoking status: Never Smoker  . Smokeless tobacco: Never Used  Substance Use Topics  . Alcohol use: Not Currently    Comment: Occasional  . Drug use: No     Allergies   Amoxicillin and Shellfish allergy   Review of Systems Review of Systems  Constitutional: Negative.   Musculoskeletal:       Foot pain, bruising, injury (left).   Physical Exam Triage Vital Signs ED Triage Vitals  Enc Vitals Group     BP 07/21/19 1656 109/72     Pulse Rate 07/21/19 1656 73     Resp --      Temp 07/21/19 1656 98.3 F (36.8 C)     Temp Source 07/21/19 1656 Oral     SpO2 07/21/19 1656 100 %     Weight 07/21/19 1653 120 lb (54.4 kg)     Height 07/21/19 1653 5\' 2"  (1.575 m)     Head Circumference --  Peak Flow --      Pain Score 07/21/19 1652 10     Pain Loc --      Pain Edu? --      Excl. in GC? --    Updated Vital Signs BP 109/72 (BP Location: Right Arm)   Pulse 73   Temp 98.3 F (36.8 C) (Oral)   Ht 5\' 2"  (1.575 m)   Wt 54.4 kg   SpO2 100%   BMI 21.95 kg/m   Visual Acuity Right Eye Distance:   Left Eye Distance:   Bilateral Distance:    Right Eye Near:   Left Eye Near:    Bilateral Near:     Physical Exam Vitals and nursing note reviewed.  Constitutional:      General: She is not in acute distress.    Appearance: Normal appearance. She is not ill-appearing.  HENT:     Head: Normocephalic and atraumatic.  Eyes:     General:        Right eye: No discharge.        Left eye: No discharge.     Conjunctiva/sclera: Conjunctivae normal.  Pulmonary:     Effort: Pulmonary effort is normal. No respiratory distress.  Musculoskeletal:     Comments: Left foot  -bruising and swelling noted on the distal aspect of the foot around the third fourth and fifth MTP joints.  Cool to the touch.  Patient able to move the toes.   Skin:    Findings: Bruising present.  Neurological:     Mental Status: She is alert.  Psychiatric:        Mood and Affect: Mood normal.        Behavior: Behavior normal.    UC Treatments / Results  Labs (all labs ordered are listed, but only abnormal results are displayed) Labs Reviewed - No data to display  EKG   Radiology DG Foot Complete Left  Result Date: 07/21/2019 CLINICAL DATA:  34 year old female with trauma to the left foot EXAM: LEFT FOOT - COMPLETE 3+ VIEW COMPARISON:  None. FINDINGS: Nondisplaced fractures of the head of the fourth metatarsal. No dislocation. No arthritic changes. There is mild soft tissue swelling of the forefoot. No radiopaque foreign object or soft tissue gas. IMPRESSION: Nondisplaced fourth metatarsal head fracture. Electronically Signed   By: 38 M.D.   On: 07/21/2019 17:27    Procedures Procedures (including critical care time)  Medications Ordered in UC Medications - No data to display  Initial Impression / Assessment and Plan / UC Course  I have reviewed the triage vital signs and the nursing notes.  Pertinent labs & imaging results that were available during my care of the patient were reviewed by me and considered in my medical decision making (see chart for details).    34 year old female presents with a nondisplaced fracture of the fourth metatarsal head.  Given crutches.  Advised no weightbearing.  Tramadol as needed for pain.  Advised rest, ice, elevation.  Patient to call orthopedics tomorrow for follow-up.  Final Clinical Impressions(s) / UC Diagnoses   Final diagnoses:  Closed nondisplaced fracture of fourth metatarsal bone of left foot, initial encounter     Discharge Instructions     Rest.  Ice.   Elevation.  Medication as needed.  Call  [facility] or [facility] tomorrow for follow up.  Take care  Dr. [facility]    ED Prescriptions    Medication Sig Dispense Auth. Provider   traMADol Adriana Simas) 50  MG tablet Take 1-2 tablets (50-100 mg total) by mouth every 8 (eight) hours as needed. 15 tablet Thersa Salt G, DO     I have reviewed the PDMP during this encounter.   Coral Spikes, [place] 07/21/19 2002

## 2019-07-21 NOTE — ED Triage Notes (Signed)
Pt presents with c/o foot injury. She dropped a 25lb barbell weight on her left foot this morning. Pt does present with swelling, bruising and pain with ambulation.

## 2020-05-24 ENCOUNTER — Other Ambulatory Visit: Payer: Self-pay

## 2020-05-24 ENCOUNTER — Encounter: Payer: Self-pay | Admitting: Emergency Medicine

## 2020-05-24 ENCOUNTER — Emergency Department
Admission: EM | Admit: 2020-05-24 | Discharge: 2020-05-24 | Disposition: A | Discharge status: Home / Self Care | Payer: Medicaid Other | Attending: Emergency Medicine | Admitting: Emergency Medicine

## 2020-05-24 DIAGNOSIS — G43909 Migraine, unspecified, not intractable, without status migrainosus: Secondary | ICD-10-CM | POA: Insufficient documentation

## 2020-05-24 DIAGNOSIS — J45909 Unspecified asthma, uncomplicated: Secondary | ICD-10-CM | POA: Insufficient documentation

## 2020-05-24 DIAGNOSIS — N390 Urinary tract infection, site not specified: Secondary | ICD-10-CM | POA: Diagnosis not present

## 2020-05-24 DIAGNOSIS — R3915 Urgency of urination: Secondary | ICD-10-CM | POA: Diagnosis present

## 2020-05-24 LAB — COMPREHENSIVE METABOLIC PANEL
ALT: 13 U/L (ref 0–44)
AST: 20 U/L (ref 15–41)
Albumin: 4.5 g/dL (ref 3.5–5.0)
Alkaline Phosphatase: 44 U/L (ref 38–126)
Anion gap: 9 (ref 5–15)
BUN: 14 mg/dL (ref 6–20)
CO2: 25 mmol/L (ref 22–32)
Calcium: 9.4 mg/dL (ref 8.9–10.3)
Chloride: 103 mmol/L (ref 98–111)
Creatinine, Ser: 0.68 mg/dL (ref 0.44–1.00)
GFR, Estimated: >60 mL/min (ref >60)
Glucose, Bld: 110 mg/dL — ABNORMAL HIGH (ref 70–99)
Potassium: 4 mmol/L (ref 3.5–5.1)
Sodium: 137 mmol/L (ref 135–145)
Total Bilirubin: 1.7 mg/dL — ABNORMAL HIGH (ref 0.3–1.2)
Total Protein: 7.7 g/dL (ref 6.5–8.1)

## 2020-05-24 LAB — CBC WITH DIFFERENTIAL/PLATELET
Abs Immature Granulocytes: 0.06 10*3/uL (ref 0.00–0.07)
Basophils Absolute: 0.1 10*3/uL (ref 0.0–0.1)
Basophils Relative: 1 %
Eosinophils Absolute: 0 10*3/uL (ref 0.0–0.5)
Eosinophils Relative: 0 %
HCT: 39.5 % (ref 36.0–46.0)
Hemoglobin: 13.3 g/dL (ref 12.0–15.0)
Immature Granulocytes: 1 %
Lymphocytes Relative: 7 %
Lymphs Abs: 0.8 10*3/uL (ref 0.7–4.0)
MCH: 31.8 pg (ref 26.0–34.0)
MCHC: 33.7 g/dL (ref 30.0–36.0)
MCV: 94.5 fL (ref 80.0–100.0)
Monocytes Absolute: 0.4 10*3/uL (ref 0.1–1.0)
Monocytes Relative: 3 %
Neutro Abs: 9.7 10*3/uL — ABNORMAL HIGH (ref 1.7–7.7)
Neutrophils Relative %: 88 %
Platelets: 376 10*3/uL (ref 150–400)
RBC: 4.18 MIL/uL (ref 3.87–5.11)
RDW: 12.6 % (ref 11.5–15.5)
WBC: 10.9 10*3/uL — ABNORMAL HIGH (ref 4.0–10.5)
nRBC: 0 % (ref 0.0–0.2)

## 2020-05-24 LAB — POC URINE PREG, ED: Preg Test, Ur: NEGATIVE

## 2020-05-24 LAB — URINALYSIS, COMPLETE (UACMP) WITH MICROSCOPIC
Bilirubin Urine: NEGATIVE
Glucose, UA: NEGATIVE mg/dL
Ketones, ur: 80 mg/dL — AB
Nitrite: NEGATIVE
Protein, ur: 30 mg/dL — AB
Specific Gravity, Urine: 1.024 (ref 1.005–1.030)
WBC, UA: >50 WBC/hpf — ABNORMAL HIGH (ref 0–5)
pH: 6 (ref 5.0–8.0)

## 2020-05-24 MED ORDER — SODIUM CHLORIDE 0.9 % IV BOLUS
500.0000 mL | Freq: Once | INTRAVENOUS | Status: AC
  Administered 2020-05-24: 500 mL via INTRAVENOUS

## 2020-05-24 MED ORDER — METOCLOPRAMIDE HCL 5 MG/ML IJ SOLN
10.0000 mg | Freq: Once | INTRAMUSCULAR | Status: AC
  Administered 2020-05-24: 10 mg via INTRAVENOUS
  Filled 2020-05-24: qty 2

## 2020-05-24 MED ORDER — DIPHENHYDRAMINE HCL 50 MG/ML IJ SOLN
25.0000 mg | Freq: Once | INTRAMUSCULAR | Status: AC
  Administered 2020-05-24: 25 mg via INTRAVENOUS
  Filled 2020-05-24: qty 1

## 2020-05-24 MED ORDER — CIPROFLOXACIN IN D5W 400 MG/200ML IV SOLN
400.0000 mg | Freq: Once | INTRAVENOUS | Status: AC
  Administered 2020-05-24: 400 mg via INTRAVENOUS
  Filled 2020-05-24: qty 200

## 2020-05-24 MED ORDER — KETOROLAC TROMETHAMINE 30 MG/ML IJ SOLN
30.0000 mg | Freq: Once | INTRAMUSCULAR | Status: AC
  Administered 2020-05-24: 30 mg via INTRAMUSCULAR
  Filled 2020-05-24: qty 1

## 2020-05-24 MED ORDER — DEXAMETHASONE SODIUM PHOSPHATE 10 MG/ML IJ SOLN
10.0000 mg | Freq: Once | INTRAMUSCULAR | Status: AC
  Administered 2020-05-24: 10 mg via INTRAVENOUS
  Filled 2020-05-24: qty 1

## 2020-05-24 MED ORDER — ONDANSETRON HCL 4 MG/2ML IJ SOLN
4.0000 mg | Freq: Once | INTRAMUSCULAR | Status: AC
  Administered 2020-05-24: 4 mg via INTRAVENOUS
  Filled 2020-05-24: qty 2

## 2020-05-24 MED ORDER — NITROFURANTOIN MONOHYD MACRO 100 MG PO CAPS: 100.0000 mg | ORAL_CAPSULE | Freq: Two times a day (BID) | ORAL | 0 refills | Status: AC

## 2020-05-24 MED ORDER — SODIUM CHLORIDE 0.9 % IV BOLUS
1000.0000 mL | Freq: Once | INTRAVENOUS | Status: AC
  Administered 2020-05-24: 1000 mL via INTRAVENOUS

## 2020-05-24 NOTE — Discharge Instructions (Signed)
Follow-up with your primary care provider if any continued problems or concerns.  A prescription for Macrobid was sent to your pharmacy.  Take the entire 7-day course which is for your urinary tract infection.  Increase fluids which may cause you to urinate more often.  You may take Tylenol as needed.  Return to the emergency department over the holiday weekend if any severe worsening of your symptoms or inability to take the antibiotic.

## 2020-05-24 NOTE — ED Provider Notes (Signed)
[facility] Emergency Department Provider Note  ____________________________________________   None    (approximate)  I have reviewed the triage vital signs and the nursing notes.   HISTORY  Chief Complaint Vaginal Bleeding   HPI Caitlin Le is a 34 y.o. female presents to the ED with complaint of urinary urgency and frequency that started yesterday and then later she saw what she believed to be vaginal spotting.  She reports that she read on Google that she could be having a "tubal pregnancy" according to her symptoms and became "freaked out".  She also complains of a headache now and has history of migraines.  She states she had Phenergan at home which did not help with her nausea.  She states that her headache is her normal migraine headache that was brought on from the stress of her thinking that she had a tubal pregnancy.       Past Medical History:  Diagnosis Date  . Asthma    hospitalized as child    Patient Active Problem List   Diagnosis Date Noted  . Contraception 03/10/2012  . Anxiety 11/15/2011  . Asthma 09/16/2011  . General medical exam 09/16/2011    Past Surgical History:  Procedure Laterality Date  . VAGINAL DELIVERY     episotomy, [facility]    Prior to Admission medications   Medication Sig Start Date End Date Taking? Authorizing Provider  albuterol (PROVENTIL HFA;VENTOLIN HFA) 108 (90 BASE) MCG/ACT inhaler Inhale 2 puffs into the lungs every 6 (six) hours as needed. 09/16/11   Shelia Media, MD  Fremanezumab-vfrm (AJOVY) 225 MG/1.5ML SOAJ Inject 1.5 mLs into the skin.    [provider]  levonorgestrel (MIRENA) 20 MCG/24HR IUD 1 each by Intrauterine route once.    [provider]  nitrofurantoin, macrocrystal-monohydrate, (MACROBID) 100 MG capsule Take 1 capsule (100 mg total) by mouth 2 (two) times daily for 7 days. 05/24/20 05/31/20  Tommi Rumps, PA-C  rizatriptan (MAXALT) 10 MG tablet Take 10 mg  by mouth as needed for migraine. May repeat in 2 hours if needed    [provider]  traMADol (ULTRAM) 50 MG tablet Take 1-2 tablets (50-100 mg total) by mouth every 8 (eight) hours as needed. 07/21/19   Tommie Sams, DO  promethazine (PHENERGAN) 25 MG tablet Take 1 tablet (25 mg total) by mouth every 6 (six) hours as needed for nausea or vomiting. 03/18/18 07/21/19  Payton Mccallum, MD    Allergies Amoxicillin and Shellfish allergy  Family History  Problem Relation Age of Onset  . Hypertension Mother   . Hyperlipidemia Mother   . Hypertension Father   . Anxiety disorder Sister   . Anxiety disorder Brother   . Asthma Son   . Cancer Paternal Aunt        Breast    Social History Social History   Tobacco Use  . Smoking status: Never Smoker  . Smokeless tobacco: Never Used  Vaping Use  . Vaping Use: Never used  Substance Use Topics  . Alcohol use: Not Currently    Comment: Occasional  . Drug use: No    Review of Systems Constitutional: No fever/chills Eyes: No visual changes. ENT: No sore throat. Cardiovascular: Denies chest pain. Respiratory: Denies shortness of breath. Gastrointestinal: No abdominal pain.  Positive nausea, positive vomiting.  No diarrhea.  No constipation. Genitourinary: Positive dysuria with urgency.  Questionable vaginal spotting. Musculoskeletal: Negative for back pain. Skin: Negative for rash. Neurological: Positive history of  migraines.  Positive frontal headache.  Negative for focal weakness or numbness. ____________________________________________   PHYSICAL EXAM:  VITAL SIGNS: ED Triage Vitals  Enc Vitals Group     BP 05/24/20 0649 (!) 95/57     Pulse Rate 05/24/20 0649 88     Resp 05/24/20 0649 18     Temp 05/24/20 0649 97.7 F (36.5 C)     Temp Source 05/24/20 0649 Oral     SpO2 05/24/20 0649 100 %     Weight 05/24/20 0650 116 lb (52.6 kg)     Height 05/24/20 0650 5\' 2"  (1.575 m)     Head Circumference --      Peak Flow --       Pain Score 05/24/20 0650 9     Pain Loc --      Pain Edu? --      Excl. in GC? --     Constitutional: Alert and oriented. Well appearing and in no acute distress.  At the time patient was placed in the exam room implied the patient was vomiting which she reports is a combination of her being worked up due to stress and her headache.  She denies any vomiting at home. Eyes: Conjunctivae are normal.  Head: Atraumatic. Neck: No stridor.   Cardiovascular: Normal rate, regular rhythm. Grossly normal heart sounds.  Good peripheral circulation. Respiratory: Normal respiratory effort.  No retractions. Lungs CTAB. Gastrointestinal: Soft and nontender. No distention.  No CVA tenderness. Musculoskeletal: Moves upper and lower extremities with any difficulty.  Normal gait was noted. Neurologic:  Normal speech and language. No gross focal neurologic deficits are appreciated. No gait instability. Skin:  Skin is warm, dry and intact. No rash noted. Psychiatric: Mood and affect are normal. Speech and behavior are normal.  ____________________________________________   LABS (all labs ordered are listed, but only abnormal results are displayed)  Labs Reviewed  URINALYSIS, COMPLETE (UACMP) WITH MICROSCOPIC - Abnormal; Notable for the following components:      Result Value   Color, Urine YELLOW (*)    APPearance CLOUDY (*)    Hgb urine dipstick SMALL (*)    Ketones, ur 80 (*)    Protein, ur 30 (*)    Leukocytes,Ua MODERATE (*)    WBC, UA >50 (*)    Bacteria, UA RARE (*)    Non Squamous Epithelial PRESENT (*)    All other components within normal limits  CBC WITH DIFFERENTIAL/PLATELET - Abnormal; Notable for the following components:   WBC 10.9 (*)    Neutro Abs 9.7 (*)    All other components within normal limits  COMPREHENSIVE METABOLIC PANEL - Abnormal; Notable for the following components:   Glucose, Bld 110 (*)    Total Bilirubin 1.7 (*)    All other components within normal limits    POC URINE PREG, ED     PROCEDURES  Procedure(s) performed (including Critical Care):  Procedures   ____________________________________________   INITIAL IMPRESSION / ASSESSMENT AND PLAN / ED COURSE  As part of my medical decision making, I reviewed the following data within the electronic MEDICAL RECORD NUMBER Notes from prior ED visits and [place] Controlled Substance Database  34 year old female presents to the ED with complaint of urinary urgency and what she believed to be vaginal spotting that began yesterday.  Urinalysis shows day BCs RBCs consistent with a hemorrhagic cystitis.  Patient was given IV Zofran along with normal saline bolus for her nausea that was seen on presentation.  Also Cipro IV  was given while she was in the ED as she is allergic to amoxicillin and was talking to her I was not comfortable with Rocephin.  Is to follow-up with taking Macrobid 100 mg twice daily for the next 7 days.  She is aware that she needs to drink fluids.  Patient also complained of a headache while in the ED which was very similar to her regular migraine headache.  Patient was given Toradol 30 mg IV which she stated much later did not give her any relief.  She was given more fluids and Decadron IV, Reglan and Benadryl.  Patient was able to get relief after these medications.  She is followed by neurology over at [facility] and is encouraged to follow-up with them for her migraine headaches.  She is aware that with the urinary tract infection should she develop any complications or any worsening of her symptoms she is return to the emergency department over the holiday weekend.  At this time it is felt that the vomiting that resolved while in the ED was due to her headache and not from her urinary tract infection.  ____________________________________________   FINAL CLINICAL IMPRESSION(S) / ED DIAGNOSES  Final diagnoses:  Acute urinary tract infection  Migraine without status migrainosus, not  intractable, unspecified migraine type     ED Discharge Orders         Ordered    nitrofurantoin, macrocrystal-monohydrate, (MACROBID) 100 MG capsule  2 times daily        05/24/20 1301          *Please note:  Caitlin Le was evaluated in Emergency Department on 05/24/2020 for the symptoms described in the history of present illness. She was evaluated in the context of the global COVID-19 pandemic, which necessitated consideration that the patient might be at risk for infection with the SARS-CoV-2 virus that causes COVID-19. Institutional protocols and algorithms that pertain to the evaluation of patients at risk for COVID-19 are in a state of rapid change based on information released by regulatory bodies including the CDC and federal and state organizations. These policies and algorithms were followed during the patient's care in the ED.  Some ED evaluations and interventions may be delayed as a result of limited staffing during and the pandemic.*   Note:  This document was prepared using Dragon voice recognition software and may include unintentional dictation errors.    Tommi Rumps, PA-C 05/24/20 1324    Shaune Pollack, MD 05/25/20 [phone]

## 2020-05-24 NOTE — ED Triage Notes (Signed)
Patient ambulatory to triage with steady gait, without difficulty or distress noted; pt reports since yesterday having urinary urgency and vaginal spotting; IUD in place; also reports frontal HA
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
## 2024-02-05 ENCOUNTER — Ambulatory Visit
Admission: RE | Admit: 2024-02-05 | Discharge: 2024-02-05 | Disposition: A | Discharge status: Home / Self Care | Source: Ambulatory Visit | Attending: Emergency Medicine | Admitting: Emergency Medicine

## 2024-02-05 VITALS — BP 101/72 | HR 76 | Temp 98.2°F | Resp 18 | Ht 61.0 in | Wt 130.0 lb

## 2024-02-05 DIAGNOSIS — R519 Headache, unspecified: Secondary | ICD-10-CM

## 2024-02-05 DIAGNOSIS — R0981 Nasal congestion: Secondary | ICD-10-CM | POA: Diagnosis not present

## 2024-02-05 MED ORDER — DOXYCYCLINE HYCLATE 100 MG PO CAPS: 100.0000 mg | ORAL_CAPSULE | Freq: Two times a day (BID) | ORAL | 0 refills | Status: AC

## 2024-02-05 MED ORDER — RIZATRIPTAN BENZOATE 10 MG PO TABS: 10.0000 mg | ORAL_TABLET | ORAL | 0 refills | Status: AC | PRN

## 2024-02-05 NOTE — Discharge Instructions (Addendum)
 Rest, push fluids, take doxycycline  and Maxalt  as prescribed.  Follow-up with your PCP  If you have new or worsening symptoms or worst headache of life go to the emergency room for further evaluation.  May take over-the-counter Flonase and allergy med of choice
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
